# Patient Record
Sex: Male | Born: 1960
Health system: Southern US, Community
[De-identification: ages and names within clinical notes are randomized; demographics above are authoritative.]

## PROBLEM LIST (undated history)

## (undated) DIAGNOSIS — Z9889 Other specified postprocedural states: Secondary | ICD-10-CM

## (undated) DIAGNOSIS — R112 Nausea with vomiting, unspecified: Secondary | ICD-10-CM

## (undated) DIAGNOSIS — R519 Headache, unspecified: Secondary | ICD-10-CM

## (undated) DIAGNOSIS — M1990 Unspecified osteoarthritis, unspecified site: Secondary | ICD-10-CM

## (undated) DIAGNOSIS — R51 Headache: Secondary | ICD-10-CM

## (undated) HISTORY — PX: RETINAL DETACHMENT SURGERY: SHX105

## (undated) HISTORY — DX: Other specified postprocedural states: Z98.890

## (undated) HISTORY — PX: KNEE SURGERY: SHX244

## (undated) HISTORY — PX: RETINAL TEAR REPAIR CRYOTHERAPY: SHX5304

---

## 2001-08-05 ENCOUNTER — Ambulatory Visit (HOSPITAL_COMMUNITY): Admission: RE | Admit: 2001-08-05 | Discharge: 2001-08-05 | Payer: Self-pay | Admitting: Family Medicine

## 2001-08-05 ENCOUNTER — Encounter: Payer: Self-pay | Admitting: Family Medicine

## 2004-08-21 ENCOUNTER — Ambulatory Visit (HOSPITAL_COMMUNITY): Admission: RE | Admit: 2004-08-21 | Discharge: 2004-08-21 | Payer: Self-pay | Admitting: Unknown Physician Specialty

## 2007-03-20 ENCOUNTER — Ambulatory Visit (HOSPITAL_COMMUNITY): Admission: RE | Admit: 2007-03-20 | Discharge: 2007-03-20 | Payer: Self-pay | Admitting: Family Medicine

## 2009-06-02 ENCOUNTER — Ambulatory Visit (HOSPITAL_COMMUNITY): Admission: RE | Admit: 2009-06-02 | Discharge: 2009-06-02 | Payer: Self-pay | Admitting: Orthopaedic Surgery

## 2009-08-02 ENCOUNTER — Ambulatory Visit (HOSPITAL_COMMUNITY): Admission: RE | Admit: 2009-08-02 | Discharge: 2009-08-02 | Payer: Self-pay | Admitting: Orthopaedic Surgery

## 2009-08-05 ENCOUNTER — Encounter (HOSPITAL_COMMUNITY): Admission: RE | Admit: 2009-08-05 | Discharge: 2009-09-04 | Payer: Self-pay | Admitting: Orthopaedic Surgery

## 2010-04-30 LAB — DIFFERENTIAL
Basophils Absolute: 0.1 10*3/uL (ref 0.0–0.1)
Basophils Relative: 1 % (ref 0–1)
Eosinophils Absolute: 0.3 10*3/uL (ref 0.0–0.7)
Monocytes Absolute: 0.7 10*3/uL (ref 0.1–1.0)
Monocytes Relative: 9 % (ref 3–12)
Neutrophils Relative %: 60 % (ref 43–77)

## 2010-04-30 LAB — BASIC METABOLIC PANEL
BUN: 10 mg/dL (ref 6–23)
CO2: 26 mEq/L (ref 19–32)
Calcium: 9.5 mg/dL (ref 8.4–10.5)
Chloride: 105 mEq/L (ref 96–112)
Creatinine, Ser: 0.81 mg/dL (ref 0.4–1.5)
GFR calc Af Amer: >60 mL/min (ref >60)
GFR calc non Af Amer: >60 mL/min (ref >60)
Glucose, Bld: 96 mg/dL (ref 70–99)
Potassium: 4.4 mEq/L (ref 3.5–5.1)
Sodium: 137 mEq/L (ref 135–145)

## 2010-04-30 LAB — CBC
HCT: 44.1 % (ref 39.0–52.0)
Hemoglobin: 14.9 g/dL (ref 13.0–17.0)
MCHC: 33.9 g/dL (ref 30.0–36.0)
MCV: 89.3 fL (ref 78.0–100.0)
Platelets: 201 10*3/uL (ref 150–400)
RBC: 4.93 MIL/uL (ref 4.22–5.81)
RDW: 14.6 % (ref 11.5–15.5)
WBC: 8.2 10*3/uL (ref 4.0–10.5)

## 2010-04-30 LAB — URINALYSIS, ROUTINE W REFLEX MICROSCOPIC
Bilirubin Urine: NEGATIVE
Glucose, UA: NEGATIVE mg/dL
Ketones, ur: NEGATIVE mg/dL
pH: 6 (ref 5.0–8.0)

## 2010-04-30 LAB — SURGICAL PCR SCREEN: MRSA, PCR: NEGATIVE

## 2010-09-19 ENCOUNTER — Ambulatory Visit (HOSPITAL_COMMUNITY)
Admission: RE | Admit: 2010-09-19 | Discharge: 2010-09-19 | Disposition: A | Discharge status: Home / Self Care | Payer: Federal, State, Local not specified - PPO | Source: Ambulatory Visit | Attending: Family Medicine | Admitting: Family Medicine

## 2010-09-19 ENCOUNTER — Other Ambulatory Visit (HOSPITAL_COMMUNITY): Payer: Self-pay | Admitting: Family Medicine

## 2010-09-19 DIAGNOSIS — F411 Generalized anxiety disorder: Secondary | ICD-10-CM

## 2010-09-19 DIAGNOSIS — R0602 Shortness of breath: Secondary | ICD-10-CM | POA: Insufficient documentation

## 2010-09-19 DIAGNOSIS — R002 Palpitations: Secondary | ICD-10-CM

## 2015-07-27 ENCOUNTER — Other Ambulatory Visit (HOSPITAL_COMMUNITY): Payer: Self-pay | Admitting: Orthopaedic Surgery

## 2015-07-27 DIAGNOSIS — M48061 Spinal stenosis, lumbar region without neurogenic claudication: Secondary | ICD-10-CM

## 2015-08-04 ENCOUNTER — Ambulatory Visit (HOSPITAL_COMMUNITY)
Admission: RE | Admit: 2015-08-04 | Discharge: 2015-08-04 | Disposition: A | Discharge status: Home / Self Care | Payer: BC Managed Care – PPO | Source: Ambulatory Visit | Attending: Orthopaedic Surgery | Admitting: Orthopaedic Surgery

## 2015-08-04 DIAGNOSIS — M4806 Spinal stenosis, lumbar region: Secondary | ICD-10-CM | POA: Insufficient documentation

## 2015-08-04 DIAGNOSIS — M47896 Other spondylosis, lumbar region: Secondary | ICD-10-CM | POA: Diagnosis not present

## 2015-08-04 DIAGNOSIS — M48061 Spinal stenosis, lumbar region without neurogenic claudication: Secondary | ICD-10-CM

## 2015-08-19 ENCOUNTER — Encounter (HOSPITAL_COMMUNITY): Payer: Self-pay | Admitting: Pharmacy Technician

## 2015-08-22 ENCOUNTER — Ambulatory Visit (HOSPITAL_COMMUNITY)
Admission: RE | Admit: 2015-08-22 | Discharge: 2015-08-22 | Disposition: A | Discharge status: Home / Self Care | Payer: BC Managed Care – PPO | Source: Ambulatory Visit | Attending: Surgery | Admitting: Surgery

## 2015-08-22 ENCOUNTER — Encounter (HOSPITAL_COMMUNITY): Payer: Self-pay

## 2015-08-22 ENCOUNTER — Encounter (HOSPITAL_COMMUNITY)
Admission: RE | Admit: 2015-08-22 | Discharge: 2015-08-22 | Disposition: A | Discharge status: Home / Self Care | Payer: BC Managed Care – PPO | Source: Ambulatory Visit | Attending: Orthopaedic Surgery | Admitting: Orthopaedic Surgery

## 2015-08-22 DIAGNOSIS — Z01818 Encounter for other preprocedural examination: Secondary | ICD-10-CM | POA: Insufficient documentation

## 2015-08-22 HISTORY — DX: Headache: R51

## 2015-08-22 HISTORY — DX: Other specified postprocedural states: Z98.890

## 2015-08-22 HISTORY — DX: Unspecified osteoarthritis, unspecified site: M19.90

## 2015-08-22 HISTORY — DX: Headache, unspecified: R51.9

## 2015-08-22 HISTORY — DX: Other specified postprocedural states: R11.2

## 2015-08-22 LAB — COMPREHENSIVE METABOLIC PANEL
ALBUMIN: 4.1 g/dL (ref 3.5–5.0)
ALT: 17 U/L (ref 17–63)
ANION GAP: 9 (ref 5–15)
AST: 17 U/L (ref 15–41)
Alkaline Phosphatase: 70 U/L (ref 38–126)
BILIRUBIN TOTAL: 0.6 mg/dL (ref 0.3–1.2)
BUN: 10 mg/dL (ref 6–20)
CO2: 26 mmol/L (ref 22–32)
Calcium: 9.4 mg/dL (ref 8.9–10.3)
Chloride: 106 mmol/L (ref 101–111)
Creatinine, Ser: 1.04 mg/dL (ref 0.61–1.24)
Glucose, Bld: 77 mg/dL (ref 65–99)
POTASSIUM: 3.8 mmol/L (ref 3.5–5.1)
Sodium: 141 mmol/L (ref 135–145)
TOTAL PROTEIN: 6.9 g/dL (ref 6.5–8.1)

## 2015-08-22 LAB — CBC
HEMATOCRIT: 45.5 % (ref 39.0–52.0)
Hemoglobin: 15.4 g/dL (ref 13.0–17.0)
MCH: 30.1 pg (ref 26.0–34.0)
MCHC: 33.8 g/dL (ref 30.0–36.0)
MCV: 88.9 fL (ref 78.0–100.0)
Platelets: 206 10*3/uL (ref 150–400)
RBC: 5.12 MIL/uL (ref 4.22–5.81)
RDW: 14.6 % (ref 11.5–15.5)
WBC: 13.1 10*3/uL — ABNORMAL HIGH (ref 4.0–10.5)

## 2015-08-22 LAB — PROTIME-INR
INR: 1 (ref 0.00–1.49)
PROTHROMBIN TIME: 13.4 s (ref 11.6–15.2)

## 2015-08-22 LAB — SURGICAL PCR SCREEN
MRSA, PCR: NEGATIVE
STAPHYLOCOCCUS AUREUS: NEGATIVE

## 2015-08-22 LAB — APTT: APTT: 33 s (ref 24–37)

## 2015-08-22 NOTE — Progress Notes (Signed)
Pt left prior to getting urine sample, order placed to obtain DOS.

## 2015-08-22 NOTE — Progress Notes (Signed)
PCP: none Cardiologist: none Pt with no cardiac hx other than "flutters" 10 years ago when he wore a heart monitor. No symptoms since then.  CXR: 08/22/15  Pt denies cough, SOB, fever, chest pain, signs of infection at this time.

## 2015-08-22 NOTE — Pre-Procedure Instructions (Signed)
    Craig [name]  08/22/2015     No Pharmacies Listed   Your procedure is scheduled on Wednesday July 12.   [facility] at 1:00 PM   Call this number if you have problems the morning of surgery:  [phone]   Remember:  Do not eat food or drink liquids after midnight.  Take these medicines the morning of surgery with A SIP OF WATER: none  7 days prior to surgery STOP taking any Aspirin, Aleve, Naproxen, Ibuprofen, Motrin, Advil, Goody's, BC's, all herbal medications, fish oil, and all vitamins    Do not wear jewelry,   Do not wear lotions, powders, or perfumes.    Men may shave face and neck.  Do not bring valuables to the hospital.  [facility] is not responsible for any belongings or valuables.  Contacts, dentures or bridgework may not be worn into surgery.  Leave your suitcase in the car.  After surgery it may be brought to your room.  For patients admitted to the hospital, discharge time will be determined by your treatment team.  Patients discharged the day of surgery will not be allowed to drive home.    Special instructions:     [name]- Preparing For Surgery  Before surgery, you can play an important role. Because skin is not sterile, your skin needs to be as free of germs as possible. You can reduce the number of germs on your skin by washing with CHG (chlorahexidine gluconate) Soap before surgery.  CHG is an antiseptic cleaner which kills germs and bonds with the skin to continue killing germs even after washing.  Please do not use if you have an allergy to CHG or antibacterial soaps. If your skin becomes reddened/irritated stop using the CHG.  Do not shave (including legs and underarms) for at least 48 hours prior to first CHG shower. It is OK to shave your face.  Please follow these instructions carefully.   1. Shower the NIGHT BEFORE SURGERY and the MORNING OF SURGERY with CHG.   2. If you chose to wash your hair, wash  your hair first as usual with your normal shampoo.  3. After you shampoo, rinse your hair and body thoroughly to remove the shampoo.  4. Use CHG as you would any other liquid soap. You can apply CHG directly to the skin and wash gently with a scrungie or a clean washcloth.   5. Apply the CHG Soap to your body ONLY FROM THE NECK DOWN.  Do not use on open wounds or open sores. Avoid contact with your eyes, ears, mouth and genitals (private parts). Wash genitals (private parts) with your normal soap.  6. Wash thoroughly, paying special attention to the area where your surgery will be performed.  7. Thoroughly rinse your body with warm water from the neck down.  8. DO NOT shower/wash with your normal soap after using and rinsing off the CHG Soap.  9. Pat yourself dry with a CLEAN TOWEL.   10. Wear CLEAN PAJAMAS   11. Place CLEAN SHEETS on your bed the night of your first shower and DO NOT SLEEP WITH PETS.    Day of Surgery: Do not apply any deodorants/lotions. Please wear clean clothes to the hospital/surgery center.

## 2015-08-23 MED ORDER — CEFAZOLIN SODIUM-DEXTROSE 2-4 GM/100ML-% IV SOLN
2.0000 g | INTRAVENOUS | Status: AC
  Administered 2015-08-24: 2 g via INTRAVENOUS
  Filled 2015-08-23: qty 100

## 2015-08-24 ENCOUNTER — Encounter (HOSPITAL_COMMUNITY): Payer: Self-pay | Admitting: Urology

## 2015-08-24 ENCOUNTER — Ambulatory Visit (HOSPITAL_COMMUNITY): Payer: BC Managed Care – PPO | Admitting: Certified Registered Nurse Anesthetist

## 2015-08-24 ENCOUNTER — Observation Stay (HOSPITAL_COMMUNITY)
Admission: RE | Admit: 2015-08-24 | Discharge: 2015-08-25 | Disposition: A | Discharge status: Home / Self Care | Payer: BC Managed Care – PPO | Source: Ambulatory Visit | Attending: Orthopaedic Surgery | Admitting: Orthopaedic Surgery

## 2015-08-24 ENCOUNTER — Ambulatory Visit (HOSPITAL_COMMUNITY): Payer: BC Managed Care – PPO

## 2015-08-24 ENCOUNTER — Encounter (HOSPITAL_COMMUNITY)
Admission: RE | Disposition: A | Discharge status: Home / Self Care | Payer: Self-pay | Source: Ambulatory Visit | Attending: Orthopaedic Surgery

## 2015-08-24 DIAGNOSIS — Z419 Encounter for procedure for purposes other than remedying health state, unspecified: Secondary | ICD-10-CM

## 2015-08-24 DIAGNOSIS — F17200 Nicotine dependence, unspecified, uncomplicated: Secondary | ICD-10-CM | POA: Insufficient documentation

## 2015-08-24 DIAGNOSIS — M1990 Unspecified osteoarthritis, unspecified site: Secondary | ICD-10-CM | POA: Insufficient documentation

## 2015-08-24 DIAGNOSIS — M47896 Other spondylosis, lumbar region: Secondary | ICD-10-CM | POA: Diagnosis not present

## 2015-08-24 DIAGNOSIS — M48061 Spinal stenosis, lumbar region without neurogenic claudication: Secondary | ICD-10-CM | POA: Diagnosis present

## 2015-08-24 DIAGNOSIS — M4806 Spinal stenosis, lumbar region: Principal | ICD-10-CM | POA: Insufficient documentation

## 2015-08-24 HISTORY — PX: LUMBAR LAMINECTOMY/DECOMPRESSION MICRODISCECTOMY: SHX5026

## 2015-08-24 SURGERY — LUMBAR LAMINECTOMY/DECOMPRESSION MICRODISCECTOMY
Anesthesia: General | Site: Back

## 2015-08-24 MED ORDER — ONDANSETRON HCL 4 MG/2ML IJ SOLN: 4.0000 mg | INTRAMUSCULAR | Status: DC | PRN

## 2015-08-24 MED ORDER — SODIUM CHLORIDE 0.9% FLUSH: 3.0000 mL | Freq: Two times a day (BID) | INTRAVENOUS | Status: DC

## 2015-08-24 MED ORDER — OXYCODONE-ACETAMINOPHEN 5-325 MG PO TABS
1.0000 | ORAL_TABLET | ORAL | Status: DC | PRN
  Administered 2015-08-24 – 2015-08-25 (×4): 2 via ORAL
  Filled 2015-08-24 (×4): qty 2

## 2015-08-24 MED ORDER — VECURONIUM BROMIDE 10 MG IV SOLR
INTRAVENOUS | Status: DC | PRN
  Administered 2015-08-24: 7 mg via INTRAVENOUS
  Administered 2015-08-24: 2 mg via INTRAVENOUS
  Administered 2015-08-24: 3 mg via INTRAVENOUS
  Administered 2015-08-24: 2 mg via INTRAVENOUS

## 2015-08-24 MED ORDER — DOCUSATE SODIUM 100 MG PO CAPS
100.0000 mg | ORAL_CAPSULE | Freq: Two times a day (BID) | ORAL | Status: DC
  Administered 2015-08-24 – 2015-08-25 (×2): 100 mg via ORAL
  Filled 2015-08-24 (×2): qty 1

## 2015-08-24 MED ORDER — METHOCARBAMOL 500 MG PO TABS
500.0000 mg | ORAL_TABLET | Freq: Four times a day (QID) | ORAL | Status: DC | PRN
  Administered 2015-08-25: 500 mg via ORAL
  Filled 2015-08-24: qty 1

## 2015-08-24 MED ORDER — SODIUM CHLORIDE 0.45 % IV SOLN: INTRAVENOUS | Status: DC

## 2015-08-24 MED ORDER — SODIUM CHLORIDE 0.9 % IV SOLN: 250.0000 mL | INTRAVENOUS | Status: DC

## 2015-08-24 MED ORDER — BUPIVACAINE HCL (PF) 0.25 % IJ SOLN
INTRAMUSCULAR | Status: DC | PRN
  Administered 2015-08-24: 10 mL

## 2015-08-24 MED ORDER — FENTANYL CITRATE (PF) 250 MCG/5ML IJ SOLN
INTRAMUSCULAR | Status: AC
  Filled 2015-08-24: qty 5

## 2015-08-24 MED ORDER — HEMOSTATIC AGENTS (NO CHARGE) OPTIME
TOPICAL | Status: DC | PRN
  Administered 2015-08-24: 1 via TOPICAL

## 2015-08-24 MED ORDER — PHENOL 1.4 % MT LIQD
1.0000 | OROMUCOSAL | Status: DC | PRN
Start: 2015-08-24 — End: 2015-08-25

## 2015-08-24 MED ORDER — SCOPOLAMINE 1 MG/3DAYS TD PT72
1.0000 | MEDICATED_PATCH | Freq: Once | TRANSDERMAL | Status: DC
  Administered 2015-08-24: 1.5 mg via TRANSDERMAL
  Filled 2015-08-24: qty 1

## 2015-08-24 MED ORDER — ACETAMINOPHEN 325 MG PO TABS: 650.0000 mg | ORAL_TABLET | ORAL | Status: DC | PRN

## 2015-08-24 MED ORDER — KETOROLAC TROMETHAMINE 30 MG/ML IJ SOLN
30.0000 mg | Freq: Once | INTRAMUSCULAR | Status: AC
  Administered 2015-08-24: 30 mg via INTRAVENOUS

## 2015-08-24 MED ORDER — FENTANYL CITRATE (PF) 100 MCG/2ML IJ SOLN
INTRAMUSCULAR | Status: DC | PRN
  Administered 2015-08-24: 50 ug via INTRAVENOUS
  Administered 2015-08-24: 100 ug via INTRAVENOUS
  Administered 2015-08-24 (×3): 50 ug via INTRAVENOUS

## 2015-08-24 MED ORDER — HYDROMORPHONE HCL 1 MG/ML IJ SOLN
INTRAMUSCULAR | Status: AC
  Filled 2015-08-24: qty 1

## 2015-08-24 MED ORDER — KETOROLAC TROMETHAMINE 30 MG/ML IJ SOLN
INTRAMUSCULAR | Status: AC
  Filled 2015-08-24: qty 1

## 2015-08-24 MED ORDER — 0.9 % SODIUM CHLORIDE (POUR BTL) OPTIME
TOPICAL | Status: DC | PRN
  Administered 2015-08-24: 1000 mL

## 2015-08-24 MED ORDER — LACTATED RINGERS IV SOLN
INTRAVENOUS | Status: DC
  Administered 2015-08-24 (×3): via INTRAVENOUS

## 2015-08-24 MED ORDER — SODIUM CHLORIDE 0.9% FLUSH: 3.0000 mL | INTRAVENOUS | Status: DC | PRN

## 2015-08-24 MED ORDER — CEFAZOLIN IN D5W 1 GM/50ML IV SOLN
1.0000 g | Freq: Three times a day (TID) | INTRAVENOUS | Status: AC
  Administered 2015-08-24 – 2015-08-25 (×2): 1 g via INTRAVENOUS
  Filled 2015-08-24 (×2): qty 50

## 2015-08-24 MED ORDER — MIDAZOLAM HCL 5 MG/5ML IJ SOLN
INTRAMUSCULAR | Status: DC | PRN
  Administered 2015-08-24: 2 mg via INTRAVENOUS

## 2015-08-24 MED ORDER — MEPERIDINE HCL 25 MG/ML IJ SOLN: 6.2500 mg | INTRAMUSCULAR | Status: DC | PRN

## 2015-08-24 MED ORDER — BUPIVACAINE HCL (PF) 0.25 % IJ SOLN
INTRAMUSCULAR | Status: AC
  Filled 2015-08-24: qty 30

## 2015-08-24 MED ORDER — ACETAMINOPHEN 650 MG RE SUPP: 650.0000 mg | RECTAL | Status: DC | PRN

## 2015-08-24 MED ORDER — PROPOFOL 10 MG/ML IV BOLUS
INTRAVENOUS | Status: DC | PRN
  Administered 2015-08-24: 40 mg via INTRAVENOUS
  Administered 2015-08-24: 200 mg via INTRAVENOUS
  Administered 2015-08-24: 120 mg via INTRAVENOUS

## 2015-08-24 MED ORDER — SUGAMMADEX SODIUM 200 MG/2ML IV SOLN
INTRAVENOUS | Status: DC | PRN
  Administered 2015-08-24: 200 mg via INTRAVENOUS

## 2015-08-24 MED ORDER — DEXAMETHASONE SODIUM PHOSPHATE 10 MG/ML IJ SOLN
INTRAMUSCULAR | Status: DC | PRN
  Administered 2015-08-24: 5 mg via INTRAVENOUS

## 2015-08-24 MED ORDER — PROMETHAZINE HCL 25 MG/ML IJ SOLN
6.2500 mg | INTRAMUSCULAR | Status: DC | PRN
Start: 2015-08-24 — End: 2015-08-24

## 2015-08-24 MED ORDER — MIDAZOLAM HCL 2 MG/2ML IJ SOLN
INTRAMUSCULAR | Status: AC
  Filled 2015-08-24: qty 2

## 2015-08-24 MED ORDER — POLYETHYLENE GLYCOL 3350 17 G PO PACK: 17.0000 g | PACK | Freq: Every day | ORAL | Status: DC | PRN

## 2015-08-24 MED ORDER — LIDOCAINE 2% (20 MG/ML) 5 ML SYRINGE
INTRAMUSCULAR | Status: DC | PRN
  Administered 2015-08-24: 100 mg via INTRAVENOUS

## 2015-08-24 MED ORDER — CHLORHEXIDINE GLUCONATE 4 % EX LIQD
60.0000 mL | Freq: Once | CUTANEOUS | Status: DC
Start: 2015-08-24 — End: 2015-08-24

## 2015-08-24 MED ORDER — HYDROMORPHONE HCL 1 MG/ML IJ SOLN
0.2500 mg | INTRAMUSCULAR | Status: DC | PRN
  Administered 2015-08-24 (×4): 0.5 mg via INTRAVENOUS

## 2015-08-24 MED ORDER — PROPOFOL 10 MG/ML IV BOLUS
INTRAVENOUS | Status: AC
  Filled 2015-08-24: qty 40

## 2015-08-24 MED ORDER — HYDROMORPHONE HCL 1 MG/ML IJ SOLN: 0.5000 mg | INTRAMUSCULAR | Status: DC | PRN

## 2015-08-24 MED ORDER — MENTHOL 3 MG MT LOZG
1.0000 | LOZENGE | OROMUCOSAL | Status: DC | PRN
Start: 2015-08-24 — End: 2015-08-25

## 2015-08-24 MED ORDER — DEXTROSE 5 % IV SOLN
500.0000 mg | Freq: Four times a day (QID) | INTRAVENOUS | Status: DC | PRN
  Filled 2015-08-24: qty 5

## 2015-08-24 MED ORDER — ONDANSETRON HCL 4 MG/2ML IJ SOLN
INTRAMUSCULAR | Status: DC | PRN
  Administered 2015-08-24: 4 mg via INTRAVENOUS

## 2015-08-24 SURGICAL SUPPLY — 51 items
ADH SKN CLS APL DERMABOND .7 (GAUZE/BANDAGES/DRESSINGS) ×1
APL SKNCLS STERI-STRIP NONHPOA (GAUZE/BANDAGES/DRESSINGS) ×1
BENZOIN TINCTURE PRP APPL 2/3 (GAUZE/BANDAGES/DRESSINGS) ×1 IMPLANT
BLADE CLIPPER SURG (BLADE) ×1 IMPLANT
BUR ROUND FLUTED 4 SOFT TCH (BURR) IMPLANT
CANISTER SUCT 3000ML PPV (MISCELLANEOUS) ×2 IMPLANT
CLSR STERI-STRIP ANTIMIC 1/2X4 (GAUZE/BANDAGES/DRESSINGS) ×2 IMPLANT
COVER SURGICAL LIGHT HANDLE (MISCELLANEOUS) ×2 IMPLANT
DECANTER SPIKE VIAL GLASS SM (MISCELLANEOUS) ×2 IMPLANT
DERMABOND ADVANCED (GAUZE/BANDAGES/DRESSINGS) ×1
DERMABOND ADVANCED .7 DNX12 (GAUZE/BANDAGES/DRESSINGS) ×1 IMPLANT
DRAPE MICROSCOPE LEICA (MISCELLANEOUS) ×2 IMPLANT
DRAPE PROXIMA HALF (DRAPES) ×4 IMPLANT
DRAPE SURG 17X23 STRL (DRAPES) ×2 IMPLANT
DRSG MEPILEX BORDER 4X4 (GAUZE/BANDAGES/DRESSINGS) ×2 IMPLANT
DURAPREP 26ML APPLICATOR (WOUND CARE) ×2 IMPLANT
ELECT BLADE 4.0 EZ CLEAN MEGAD (MISCELLANEOUS) ×2
ELECT REM PT RETURN 9FT ADLT (ELECTROSURGICAL) ×2
ELECTRODE BLDE 4.0 EZ CLN MEGD (MISCELLANEOUS) IMPLANT
ELECTRODE REM PT RTRN 9FT ADLT (ELECTROSURGICAL) ×1 IMPLANT
GLOVE BIOGEL PI IND STRL 8 (GLOVE) ×2 IMPLANT
GLOVE BIOGEL PI INDICATOR 8 (GLOVE) ×2
GLOVE ORTHO TXT STRL SZ7.5 (GLOVE) ×4 IMPLANT
GOWN STRL REUS W/ TWL LRG LVL3 (GOWN DISPOSABLE) ×2 IMPLANT
GOWN STRL REUS W/ TWL XL LVL3 (GOWN DISPOSABLE) ×1 IMPLANT
GOWN STRL REUS W/TWL 2XL LVL3 (GOWN DISPOSABLE) ×2 IMPLANT
GOWN STRL REUS W/TWL LRG LVL3 (GOWN DISPOSABLE) ×4
GOWN STRL REUS W/TWL XL LVL3 (GOWN DISPOSABLE) ×2
KIT BASIN OR (CUSTOM PROCEDURE TRAY) ×2 IMPLANT
KIT ROOM TURNOVER OR (KITS) ×2 IMPLANT
MANIFOLD NEPTUNE II (INSTRUMENTS) ×2 IMPLANT
NDL HYPO 25GX1X1/2 BEV (NEEDLE) ×1 IMPLANT
NDL SPNL 18GX3.5 QUINCKE PK (NEEDLE) ×1 IMPLANT
NEEDLE HYPO 25GX1X1/2 BEV (NEEDLE) ×2 IMPLANT
NEEDLE SPNL 18GX3.5 QUINCKE PK (NEEDLE) ×2 IMPLANT
NS IRRIG 1000ML POUR BTL (IV SOLUTION) ×2 IMPLANT
PACK LAMINECTOMY ORTHO (CUSTOM PROCEDURE TRAY) ×2 IMPLANT
PAD ARMBOARD 7.5X6 YLW CONV (MISCELLANEOUS) ×4 IMPLANT
PATTIES SURGICAL .5 X.5 (GAUZE/BANDAGES/DRESSINGS) IMPLANT
PATTIES SURGICAL .75X.75 (GAUZE/BANDAGES/DRESSINGS) IMPLANT
SPONGE LAP 4X18 X RAY DECT (DISPOSABLE) ×1 IMPLANT
SURGIFLO W/THROMBIN 8M KIT (HEMOSTASIS) ×1 IMPLANT
SUT VIC AB 0 CT1 27 (SUTURE) ×2
SUT VIC AB 0 CT1 27XBRD ANBCTR (SUTURE) ×1 IMPLANT
SUT VIC AB 1 CT1 27 (SUTURE) ×2
SUT VIC AB 1 CT1 27XBRD ANBCTR (SUTURE) IMPLANT
SUT VIC AB 2-0 CT1 27 (SUTURE) ×2
SUT VIC AB 2-0 CT1 TAPERPNT 27 (SUTURE) ×1 IMPLANT
SUT VIC AB 3-0 X1 27 (SUTURE) IMPLANT
TOWEL OR 17X24 6PK STRL BLUE (TOWEL DISPOSABLE) ×2 IMPLANT
TOWEL OR 17X26 10 PK STRL BLUE (TOWEL DISPOSABLE) ×2 IMPLANT

## 2015-08-24 NOTE — Progress Notes (Signed)
Pt unable to void at this time, states he will try to void prior to surgery for u/a.

## 2015-08-24 NOTE — Brief Op Note (Signed)
08/24/2015  5:16 PM  PATIENT:  Craig [name]  [age] y.o. male  PRE-OPERATIVE DIAGNOSIS:  L3-4 STENOSIS  POST-OPERATIVE DIAGNOSIS:  L3-4 STENOSIS  PROCEDURE:  Procedure(s): L3-4 CENTRAL DECOMPRESSION  (N/A)  SURGEON:  Surgeon(s) and Role:    * Eldred MangesMark C Yates, MD - Primary  PHYSICIAN ASSISTANT: Priscilla Kirstein m Dyshawn Cangelosi pa-c    ANESTHESIA:   general  EBL:  Total I/O In: 1000 [I.V.:1000] Out: 75 [Blood:75]  BLOOD ADMINISTERED:none  DRAINS: none   LOCAL MEDICATIONS USED:  NONE  SPECIMEN:  No Specimen  DISPOSITION OF SPECIMEN:  N/A  COUNTS:  YES  TOURNIQUET:  * No tourniquets in log *  DICTATION: .Dragon Dictation  PLAN OF CARE: Admit for overnight observation  PATIENT DISPOSITION:  PACU - hemodynamically stable.

## 2015-08-24 NOTE — Interval H&P Note (Signed)
History and Physical Interval Note:  08/24/2015 2:20 PM  Craig NancyRandy A [name]  has presented today for surgery, with the diagnosis of L3-4 STENOSIS  The various methods of treatment have been discussed with the patient and family. After consideration of risks, benefits and other options for treatment, the patient has consented to  Procedure(s): L3-4 CENTRAL DECOMPRESSION, POSSIBLE L3-4 MICRODISCECTOMY  (N/A) as a surgical intervention .  The patient's history has been reviewed, patient examined, no change in status, stable for surgery.  I have reviewed the patient's chart and labs.  Questions were answered to the patient's satisfaction.     Craig [name]

## 2015-08-24 NOTE — Anesthesia Procedure Notes (Signed)
Procedure Name: Intubation Date/Time: 08/24/2015 3:41 PM Performed by: Bobbie StackANDERSON, Allesandra Huebsch KIRSTEN Pre-anesthesia Checklist: Patient identified, Emergency Drugs available, Patient being monitored, Suction available and Timeout performed Patient Re-evaluated:Patient Re-evaluated prior to inductionOxygen Delivery Method: Circle system utilized Preoxygenation: Pre-oxygenation with 100% oxygen Intubation Type: IV induction Ventilation: Mask ventilation without difficulty Laryngoscope Size: Miller and 3 Grade View: Grade I Tube type: Oral Tube size: 8.0 mm Number of attempts: 1 Airway Equipment and Method: Stylet Placement Confirmation: ETT inserted through vocal cords under direct vision,  positive ETCO2 and breath sounds checked- equal and bilateral Secured at: 25 cm Tube secured with: Tape Dental Injury: Teeth and Oropharynx as per pre-operative assessment

## 2015-08-24 NOTE — Anesthesia Preprocedure Evaluation (Signed)
Anesthesia Evaluation  Patient identified by MRN, date of birth, ID band Patient awake    Reviewed: Allergy & Precautions, H&P , NPO status , Patient's Chart, lab work & pertinent test results, reviewed documented beta blocker date and time   Airway Mallampati: I  TM Distance: >3 FB Neck ROM: full    Dental no notable dental hx. (+) Teeth Intact   Pulmonary Current Smoker,    Pulmonary exam normal        Cardiovascular negative cardio ROS Normal cardiovascular exam     Neuro/Psych negative psych ROS   GI/Hepatic negative GI ROS, Neg liver ROS,   Endo/Other  negative endocrine ROS  Renal/GU negative Renal ROS     Musculoskeletal   Abdominal Normal abdominal exam  (+)   Peds  Hematology negative hematology ROS (+)   Anesthesia Other Findings   Reproductive/Obstetrics negative OB ROS                             Anesthesia Physical Anesthesia Plan  ASA: II  Anesthesia Plan: General   Post-op Pain Management:    Induction: Intravenous  Airway Management Planned: Oral ETT  Additional Equipment:   Intra-op Plan:   Post-operative Plan: Extubation in OR  Informed Consent: I have reviewed the patients History and Physical, chart, labs and discussed the procedure including the risks, benefits and alternatives for the proposed anesthesia with the patient or authorized representative who has indicated his/her understanding and acceptance.   Dental Advisory Given  Plan Discussed with: CRNA, Surgeon and Anesthesiologist  Anesthesia Plan Comments:         Anesthesia Quick Evaluation

## 2015-08-24 NOTE — Transfer of Care (Signed)
Immediate Anesthesia Transfer of Care Note  Patient: Craig [name]  Procedure(s) Performed: Procedure(s): L3-4 CENTRAL DECOMPRESSION  (N/A)  Patient Location: PACU  Anesthesia Type:General  Level of Consciousness: awake and patient cooperative  Airway & Oxygen Therapy: Patient Spontanous Breathing and Patient connected to nasal cannula oxygen  Post-op Assessment: Report given to RN and Patient moving all extremities X 4  Post vital signs: Reviewed and stable  Last Vitals:  Filed Vitals:   08/24/15 1340  BP: 118/76  Pulse: 81  Temp: 37.2 C  Resp: 20    Last Pain:  Filed Vitals:   08/24/15 1613  PainSc: 0-No pain         Complications: No apparent anesthesia complications

## 2015-08-24 NOTE — H&P (Signed)
Craig [name] is an 55 y.o. male.   A 55 year old male returns with ongoing problems with gradually progressive back pain and pain with standing.  He does better bending forward and leaning.  He has problems with prolonged walking with leg pain.  He gets relief with sitting.  He does better if he leans over a grocery cart.  He has had a previous MRI in 2006 and has had a new MRI for comparison due to his progression of symptoms, done on 08/04/2015, which is available for review.   PAST SURGICAL HISTORY:  Include past knee surgeries, all on his right knee.   SOCIAL HISTORY:  He is here with his wife.  He is married.  He has been a smoker.  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     Past Medical History  Diagnosis Date  . PONV (postoperative nausea and vomiting)     after knee surgeries  . Headache     occasional migraines treated with excedrin "a long time ago"  . Arthritis     Past Surgical History  Procedure Laterality Date  . Knee surgery      "bone spurr or cartilage operations", x6    No family history on file. Social History:  reports that he has been smoking Cigars.  He does not have any smokeless tobacco history on file. He reports that he does not drink alcohol or use illicit drugs.  Allergies:  Allergies  Allergen Reactions  . No Known Allergies     No prescriptions prior to admission    Results for orders placed or performed during the hospital encounter of 08/22/15 (from the past 48 hour(s))  APTT     Status: None   Collection Time: 08/22/15  2:40 PM  Result Value Ref Range   aPTT 33 24 - 37 seconds  CBC     Status: Abnormal   Collection Time: 08/22/15  2:40 PM  Result Value Ref Range   WBC 13.1 (H) 4.0 - 10.5 K/uL   RBC 5.12 4.22 - 5.81 MIL/uL   Hemoglobin 15.4 13.0 - 17.0 g/dL   HCT 45.5 39.0 - 52.0 %   MCV 88.9 78.0 - 100.0 fL   MCH 30.1 26.0 - 34.0 pg   MCHC 33.8  30.0 - 36.0 g/dL   RDW 14.6 11.5 - 15.5 %   Platelets 206 150 - 400 K/uL  Comprehensive metabolic panel     Status: None   Collection Time: 08/22/15  2:40 PM  Result Value Ref Range   Sodium 141 135 - 145 mmol/L   Potassium 3.8 3.5 - 5.1 mmol/L   Chloride 106 101 - 111 mmol/L   CO2 26 22 - 32 mmol/L   Glucose, Bld 77 65 - 99 mg/dL   BUN 10 6 - 20 mg/dL   Creatinine, Ser 1.04 0.61 - 1.24 mg/dL   Calcium 9.4 8.9 - 10.3 mg/dL   Total Protein 6.9 6.5 - 8.1 g/dL   Albumin 4.1 3.5 - 5.0 g/dL   AST 17 15 - 41 U/L   ALT 17 17 - 63 U/L   Alkaline Phosphatase 70 38 - 126 U/L   Total Bilirubin 0.6 0.3 - 1.2 mg/dL   GFR calc non Af Amer >60 >60 mL/min   GFR calc Af Amer >60 >60 mL/min    Comment: (NOTE) The eGFR has been calculated using the  CKD EPI equation. This calculation has not been validated in all clinical situations. eGFR's persistently <60 mL/min signify possible Chronic Kidney Disease.    Anion gap 9 5 - 15  Protime-INR     Status: None   Collection Time: 08/22/15  2:40 PM  Result Value Ref Range   Prothrombin Time 13.4 11.6 - 15.2 seconds   INR 1.00 0.00 - 1.49  Surgical pcr screen     Status: None   Collection Time: 08/22/15  2:40 PM  Result Value Ref Range   MRSA, PCR NEGATIVE NEGATIVE   Staphylococcus aureus NEGATIVE NEGATIVE    Comment:        The Xpert SA Assay (FDA approved for NASAL specimens in patients over 64 years of age), is one component of a comprehensive surveillance program.  Test performance has been validated by [facility] for patients greater than or equal to 29 year old. It is not intended to diagnose infection nor to guide or monitor treatment.    Dg Chest 2 View  08/22/2015  CLINICAL DATA:  55 year old male preoperative study for lumbar surgery. Initial encounter. EXAM: CHEST  2 VIEW COMPARISON:  09/19/2010. FINDINGS: Small calcified hilar lymph nodes again noted. Mediastinal contours remain normal. Lung volumes are stable and within normal  limits. No pneumothorax, pulmonary edema, pleural effusion or confluent pulmonary opacity. Visualized tracheal air column is within normal limits. No acute osseous abnormality identified. IMPRESSION: No acute cardiopulmonary abnormality. Electronically Signed   By: Genevie Ann M.D.   On: 08/22/2015 16:04    Review of Systems  Constitutional: Negative.   HENT: Negative.   Eyes: Negative.   Respiratory: Negative.   Cardiovascular: Negative.   Gastrointestinal: Negative.   Genitourinary: Negative.   Musculoskeletal: Positive for back pain.  Skin: Negative.   Neurological: Negative.   Psychiatric/Behavioral: Negative.     There were no vitals taken for this visit. Physical Exam  Constitutional: He is oriented to person, place, and time. No distress.  HENT:  Head: Atraumatic.  Eyes: EOM are normal.  Neck: Normal range of motion.  Cardiovascular: Normal rate.   Respiratory: No respiratory distress.  GI: He exhibits no distension.  Musculoskeletal: He exhibits tenderness.  Neurological: He is alert and oriented to person, place, and time.  Skin: Skin is warm and dry.  Psychiatric: He has a normal mood and affect.     PHYSICAL EXAMINATION:  The patient is alert and oriented.  Height 6 feet 5 inches.  Weight 200 pounds.  BP 106/70.  Pulse 76.  Extraocular movements are intact.  Good visual acuity with glasses.  Mild brachial plexus tenderness.  Some discomfort with forward flexion and extension with 20% to 30% loss of rotation.  Upper extremity reflexes are intact.  Quads, hamstrings, anterior tib, EHL, heel-and-toe walking is normal.  No abdominal tenderness.  No audible wheezing.  Distal pulses are normal.    RADIOGRAPHS:  X-rays  show facet hypertrophy at L4-5 and L5-S1.  MRI scan on 08/04/2015, shows shallow disc at L2-3 on the left.  He is having more symptoms on the right.  This really has only mildly progressed.  He only has mild narrowing.  At L3-4, he has had significant progression to  moderate to severe central canal stenosis, primarily due to ligament hypertrophy and less facet hypertrophy and less disc bulging.  Shallow bulge at L4-5 with only mild narrowing.   ASSESSMENT:  Progressive spondylosis at L3-4, now with moderately severe central canal stenosis and moderately severe left  foraminal narrowing.    PLAN:  We discussed options and recommendations for him.  At present he is having spinal stenosis symptoms, primarily more than radicular symptoms, and options would be a central decompression procedure done at the L3-4 level with possible left L3-4 microdiscectomy if needed.  We discussed overnight stay, operative intervention, the possibility of a dural tear.  Rate quoted less than 5% for first-time decompression surgery.  We reviewed the scans and looked at his old scan from 2006 and the progression to his current scan.  Postoperative walking program discussed.  Usual length of time going back to work 3 to 6 weeks.  He asked if he would be able to play golf some and this may be related to mild changes at his other levels and he understands that it should help with his standing and walking and neurogenic claudication symptoms, but he might still have problems if he tried to resume golf activities.  He can consider his options.  We discussed the use of the operative microscope and operative technique.  He will call if he would like to proceed with scheduling.    Lanae Crumbly, PA-C 08/24/2015, 10:54 AM

## 2015-08-25 ENCOUNTER — Encounter (HOSPITAL_COMMUNITY): Payer: Self-pay | Admitting: Orthopaedic Surgery

## 2015-08-25 DIAGNOSIS — M4806 Spinal stenosis, lumbar region: Secondary | ICD-10-CM | POA: Diagnosis not present

## 2015-08-25 MED ORDER — METHOCARBAMOL 500 MG PO TABS: 500.0000 mg | ORAL_TABLET | Freq: Four times a day (QID) | ORAL | Status: DC | PRN

## 2015-08-25 MED ORDER — OXYCODONE-ACETAMINOPHEN 5-325 MG PO TABS: 1.0000 | ORAL_TABLET | ORAL | Status: DC | PRN

## 2015-08-25 NOTE — Progress Notes (Signed)
Subjective: 1 Day Post-Op Procedure(s) (LRB): L3-4 CENTRAL DECOMPRESSION  (N/A) Patient reports pain as mild.    Objective: Vital signs in last 24 hours: Temp:  [98.2 F (36.8 C)-98.9 F (37.2 C)] 98.2 F (36.8 C) (07/13 0203) Pulse Rate:  [60-81] 60 (07/13 0203) Resp:  [13-24] 17 (07/13 0203) BP: (108-123)/(67-82) 109/76 mmHg (07/13 0203) SpO2:  [96 %-100 %] 99 % (07/13 0203) Weight:  [94.802 kg (209 lb)] 94.802 kg (209 lb) (07/12 1340)  Intake/Output from previous day: 07/12 0701 - 07/13 0700 In: 1100 [I.V.:1100] Out: 600 [Urine:525; Blood:75] Intake/Output this shift:     Recent Labs  08/22/15 1440  HGB 15.4    Recent Labs  08/22/15 1440  WBC 13.1*  RBC 5.12  HCT 45.5  PLT 206    Recent Labs  08/22/15 1440  NA 141  K 3.8  CL 106  CO2 26  BUN 10  CREATININE 1.04  GLUCOSE 77  CALCIUM 9.4    Recent Labs  08/22/15 1440  INR 1.00    Neurologically intact  Assessment/Plan: 1 Day Post-Op Procedure(s) (LRB): L3-4 CENTRAL DECOMPRESSION  (N/A) Plan: discharge home  Craig [name] C 08/25/2015, 7:29 AM

## 2015-08-25 NOTE — Progress Notes (Signed)
Discharge instructions given. Pt verbalized understanding and all questions were answered.  

## 2015-08-25 NOTE — Discharge Instructions (Signed)
Ok to shower. Apply dressing after shower so shirt does not rub on incision. Walk daily. See Dr. Ophelia CharterYates in one week

## 2015-08-25 NOTE — Op Note (Signed)
NAMValentino Nose:  [name], Craig [name]                ACCOUNT NO.:  [id number]  MEDICAL RECORD NO.:  [id number]  LOCATION:                               FACILITY:  [facility]  PHYSICIAN:  [name] C. Ophelia CharterYates, M.D.    DATE OF BIRTH:  [birth year]  DATE OF PROCEDURE: DATE OF DISCHARGE:                              OPERATIVE REPORT   PREOPERATIVE DIAGNOSIS:  L3-4 central stenosis.  POSTOPERATIVE DIAGNOSES:  L3-4 central stenosis.  PROCEDURE:  L3-4 decompression, complete laminectomy L3, partial laminectomy L4 with bilateral lateral recess decompression.  SURGEON:  [name], M.D.  ASSISTANT:  [name], PA-C, medically necessary and present for the entire procedure.  ANESTHESIA:  General plus Marcaine local.  ESTIMATED BLOOD LOSS:  100 mL.  DESCRIPTION OF PROCEDURE:  After induction of general anesthesia, patient was placed prone, careful padding arms at 90, rolled yellow foam pads underneath the shoulders.  Back was prepped with DuraPrep, calf bumpers. Preoperative Ancef. Time-out procedure. Area squared with towels, Betadine, Steri-Drape laminectomy sheets, spinal needles placed at the planned level based on palpable landmarks.  This was appropriate above and below the L3-4 level.  Midline incision was made. Subperiosteal dissection on the lamina self-retaining McCullough retractor.  Second x-ray was taken with the Kocher clamp was placed above and below for the 3-4 level for planned area of decompression. Posterior elements were removed, removing the spinous process of L3, top portion of L4.  Lamina was thinned with a rongeur and then with the 4 mm bur.  Thick chunks of ligament were removed.  There was extreme hypertrophy of the ligamentum causing severe stenosis, which look more impressive than the preoperative MRI.  There was crystalline type formation either from epidurals or possibly from gout present, intermixed with the ligament.  Only mild overhanging spurs were present from the facets.   Once the ligaments were removed from the gutters using operative microscope, there was minimal bulging of the discs.  No microdiskectomy was performed.  Lateral recess decompression was performed bilaterally and the [facility] retractor could be passed out the foramina over the top of the nerve and shoulder of the nerve without areas of compression.  Some Surgiflo was placed in the gutters to control some venous oozing.  Some bone wax had been used on the bone edges.  Copious irrigation.  Standard closure #1 Vicryl, 2-0 Vicryl subcutaneous tissue, subcuticular closure Marcaine infiltration, skin postop dressing and transferred to recovery room. Instrument count and needle count was correct.     [name], M.D.   ______________________________ [name], M.D.    MCY/MEDQ  D:  08/24/2015  T:  08/25/2015  Job:  [id number]

## 2015-08-25 NOTE — H&P (Deleted)
NAMValentino Nose:  Mastropietro, Marcy                ACCOUNT NO.:  [id number]  MEDICAL RECORD NO.:  [id number]  LOCATION:                               FACILITY:  [facility]  PHYSICIAN:  Cedricka Sackrider C. Ophelia CharterYates, M.D.    DATE OF BIRTH:  03/22/1960  DATE OF ADMISSION:  08/24/2015 DATE OF DISCHARGE:                             Operative Note   PREOPERATIVE DIAGNOSIS:  L3-4 central stenosis.  POSTOPERATIVE DIAGNOSES:  L3-4 central stenosis.  PROCEDURE:  L3-4 decompression, complete laminectomy L3, partial laminectomy L4 with bilateral lateral recess decompression.  SURGEON:  Riordan Walle C. Ophelia CharterYates, M.D.  ASSISTANT:  [name], PA-C, medically necessary and present for the entire procedure.  ANESTHESIA:  General plus Marcaine local.  ESTIMATED BLOOD LOSS:  100 mL.  DESCRIPTION OF PROCEDURE:  After induction of general anesthesia, patient was placed prone, careful padding arms at 90, rolled yellow foam pads underneath the shoulders.  Back was prepped with DuraPrep, calf bumpers. Preoperative Ancef. Time-out procedure. Area squared with towels, Betadine, Steri-Drape laminectomy sheets, spinal needles placed at the planned level based on palpable landmarks.  This was appropriate above and below the L3-4 level.  Midline incision was made. Subperiosteal dissection on the lamina self-retaining McCullough retractor.  Second x-ray was taken with the Kocher clamp was placed above and below for the 3-4 level for planned area of decompression. Posterior elements were removed, removing the spinous process of L3, top portion of L4.  Lamina was thinned with a rongeur and then with the 4 mm bur.  Thick chunks of ligament were removed.  There was extreme hypertrophy of the ligamentum causing severe stenosis, which look more impressive than the preoperative MRI.  There was crystalline type formation either from epidurals or possibly from gout present, intermixed with the ligament.  Only mild overhanging spurs were present from the  facets.  Once the ligaments were removed from the gutters using operative microscope, there was minimal bulging of the discs.  No microdiskectomy was performed.  Lateral recess decompression was performed bilaterally and the [facility] retractor could be passed out the foramina over the top of the nerve and shoulder of the nerve without areas of compression.  Some Surgiflo was placed in the gutters to control some venous oozing.  Some bone wax had been used on the bone edges.  Copious irrigation.  Standard closure #1 Vicryl, 2-0 Vicryl subcutaneous tissue, subcuticular closure Marcaine infiltration, skin postop dressing and transferred to recovery room. Instrument count and needle count was correct.     Colbie Sliker C. Ophelia CharterYates, M.D.   ______________________________ [name], M.D.    MCY/MEDQ  D:  08/24/2015  T:  08/25/2015  Job:  [id number]

## 2015-09-07 NOTE — Anesthesia Postprocedure Evaluation (Signed)
Anesthesia Post Note  Patient: Craig [name]  Procedure(s) Performed: Procedure(s) (LRB): L3-4 CENTRAL DECOMPRESSION  (N/A)  Patient location during evaluation: PACU Anesthesia Type: General Level of consciousness: awake and alert Pain management: pain level controlled Vital Signs Assessment: post-procedure vital signs reviewed and stable Respiratory status: spontaneous breathing, nonlabored ventilation, respiratory function stable and patient connected to nasal cannula oxygen Cardiovascular status: blood pressure returned to baseline and stable Postop Assessment: no signs of nausea or vomiting Anesthetic complications: no    Last Vitals:  Vitals:   08/25/15 0203 08/25/15 0635  BP: 109/76 118/76  Pulse: 60 (!) 57  Resp: 17 15  Temp: 36.8 C 37.4 C    Last Pain:  Vitals:   08/25/15 1007  TempSrc:   PainSc: 7                  Kennieth Rad

## 2015-09-15 NOTE — Discharge Summary (Signed)
Patient ID: JI FELDNER MRN: [id number] DOB/AGE: 53/20/1962 53 y.o.  Admit date: 08/24/2015 Discharge date: 09/15/2015  Admission Diagnoses:  Active Problems:   Lumbar stenosis   Discharge Diagnoses:  Active Problems:   Lumbar stenosis  status post Procedure(s): L3-4 CENTRAL DECOMPRESSION   Past Medical History:  Diagnosis Date  . Arthritis   . Headache    occasional migraines treated with excedrin "a long time ago"  . PONV (postoperative nausea and vomiting)    after knee surgeries    Surgeries: Procedure(s): L3-4 CENTRAL DECOMPRESSION  on 08/24/2015   Consultants:   Discharged Condition: Improved  Hospital Course: Craig [name] is an 55 y.o. male who was admitted 08/24/2015 for operative treatment of lumbar stenosis. Patient failed conservative treatments (please see the history and physical for the specifics) and had severe unremitting pain that affects sleep, daily activities and work/hobbies. After pre-op clearance, the patient was taken to the operating room on 08/24/2015 and underwent  Procedure(s): L3-4 CENTRAL DECOMPRESSION .    Patient was given perioperative antibiotics:  Anti-infectives    Start     Dose/Rate Route Frequency Ordered Stop   08/24/15 2200  ceFAZolin (ANCEF) IVPB 1 g/50 mL premix     1 g 100 mL/hr over 30 Minutes Intravenous Every 8 hours 08/24/15 1851 08/25/15 0645   08/24/15 1430  ceFAZolin (ANCEF) IVPB 2g/100 mL premix     2 g 200 mL/hr over 30 Minutes Intravenous To [facility] 08/23/15 1020 08/24/15 1615       Patient was given sequential compression devices and early ambulation to prevent DVT.   Patient benefited maximally from hospital stay and there were no complications. At the time of discharge, the patient was urinating/moving their bowels without difficulty, tolerating a regular diet, pain is controlled with oral pain medications and they have been cleared by PT/OT.   Recent vital signs: No data found.    Recent  laboratory studies: No results for input(s): WBC, HGB, HCT, PLT, NA, K, CL, CO2, BUN, CREATININE, GLUCOSE, INR, CALCIUM in the last 72 hours.  Invalid input(s): PT, 2   Discharge Medications:     Medication List    TAKE these medications   methocarbamol 500 MG tablet Commonly known as:  ROBAXIN Take 1 tablet (500 mg total) by mouth every 6 (six) hours as needed for muscle spasms.   multivitamin tablet Take 1 tablet by mouth daily. Patient takes 3-4 times a week "Daily advantage"   oxyCODONE-acetaminophen 5-325 MG tablet Commonly known as:  PERCOCET/ROXICET Take 1-2 tablets by mouth every 4 (four) hours as needed for moderate pain.       Diagnostic Studies: Dg Chest 2 View  Result Date: 08/22/2015 CLINICAL DATA:  55 year old male preoperative study for lumbar surgery. Initial encounter. EXAM: CHEST  2 VIEW COMPARISON:  09/19/2010. FINDINGS: Small calcified hilar lymph nodes again noted. Mediastinal contours remain normal. Lung volumes are stable and within normal limits. No pneumothorax, pulmonary edema, pleural effusion or confluent pulmonary opacity. Visualized tracheal air column is within normal limits. No acute osseous abnormality identified. IMPRESSION: No acute cardiopulmonary abnormality. Electronically Signed   By: Odessa Fleming M.D.   On: 08/22/2015 16:04   Dg Lumbar Spine 2-3 Views  Result Date: 08/24/2015 CLINICAL DATA:  Localizers for L3-4 central decompression. EXAM: LUMBAR SPINE - 2-3 VIEW COMPARISON:  08/04/2015 MR FINDINGS: The same numbering system from recent MRI will be utilized. A single lateral intraoperative view of the lumbar spine demonstrate 2 posterior metallic  needles. The superior needle lies between the L2 and L3 spinous processes directed at the superior aspect of L3. The inferior needle is directed at the L3-4 interspace. IMPRESSION: Localizers as described. Electronically Signed   By: Harmon Pier M.D.   On: 08/24/2015 17:53      Follow-up Information     Follow up In 1 week.           Discharge Plan:  discharge to home  Disposition:     Signed: Naida Sleight  09/15/2015, 10:45 AM

## 2018-11-04 ENCOUNTER — Inpatient Hospital Stay (HOSPITAL_COMMUNITY)
Admission: EM | Admit: 2018-11-04 | Discharge: 2018-11-06 | DRG: 287 | Disposition: A | Discharge status: Home / Self Care | Payer: BC Managed Care – PPO | Attending: Interventional Cardiology | Admitting: Interventional Cardiology

## 2018-11-04 ENCOUNTER — Encounter (HOSPITAL_COMMUNITY): Payer: Self-pay | Admitting: Emergency Medicine

## 2018-11-04 ENCOUNTER — Emergency Department (HOSPITAL_COMMUNITY): Payer: BC Managed Care – PPO

## 2018-11-04 ENCOUNTER — Other Ambulatory Visit: Payer: Self-pay

## 2018-11-04 DIAGNOSIS — F17290 Nicotine dependence, other tobacco product, uncomplicated: Secondary | ICD-10-CM | POA: Diagnosis present

## 2018-11-04 DIAGNOSIS — Z8249 Family history of ischemic heart disease and other diseases of the circulatory system: Secondary | ICD-10-CM

## 2018-11-04 DIAGNOSIS — R002 Palpitations: Secondary | ICD-10-CM | POA: Diagnosis present

## 2018-11-04 DIAGNOSIS — I514 Myocarditis, unspecified: Secondary | ICD-10-CM | POA: Diagnosis not present

## 2018-11-04 DIAGNOSIS — R079 Chest pain, unspecified: Secondary | ICD-10-CM | POA: Diagnosis not present

## 2018-11-04 DIAGNOSIS — R778 Other specified abnormalities of plasma proteins: Secondary | ICD-10-CM | POA: Diagnosis present

## 2018-11-04 DIAGNOSIS — I214 Non-ST elevation (NSTEMI) myocardial infarction: Secondary | ICD-10-CM

## 2018-11-04 DIAGNOSIS — Z20828 Contact with and (suspected) exposure to other viral communicable diseases: Secondary | ICD-10-CM | POA: Diagnosis present

## 2018-11-04 DIAGNOSIS — R7989 Other specified abnormal findings of blood chemistry: Secondary | ICD-10-CM | POA: Diagnosis present

## 2018-11-04 DIAGNOSIS — Z7982 Long term (current) use of aspirin: Secondary | ICD-10-CM

## 2018-11-04 LAB — BASIC METABOLIC PANEL
Anion gap: 11 (ref 5–15)
BUN: 19 mg/dL (ref 6–20)
CO2: 23 mmol/L (ref 22–32)
Calcium: 9.1 mg/dL (ref 8.9–10.3)
Chloride: 103 mmol/L (ref 98–111)
Creatinine, Ser: 0.95 mg/dL (ref 0.61–1.24)
GFR calc Af Amer: >60 mL/min (ref >60)
GFR calc non Af Amer: >60 mL/min (ref >60)
Glucose, Bld: 110 mg/dL — ABNORMAL HIGH (ref 70–99)
Potassium: 4 mmol/L (ref 3.5–5.1)
Sodium: 137 mmol/L (ref 135–145)

## 2018-11-04 LAB — CBC
HCT: 42.9 % (ref 39.0–52.0)
Hemoglobin: 13.7 g/dL (ref 13.0–17.0)
MCH: 28.9 pg (ref 26.0–34.0)
MCHC: 31.9 g/dL (ref 30.0–36.0)
MCV: 90.5 fL (ref 80.0–100.0)
Platelets: 243 10*3/uL (ref 150–400)
RBC: 4.74 MIL/uL (ref 4.22–5.81)
RDW: 14.2 % (ref 11.5–15.5)
WBC: 15.2 10*3/uL — ABNORMAL HIGH (ref 4.0–10.5)
nRBC: 0 % (ref 0.0–0.2)

## 2018-11-04 LAB — TROPONIN I (HIGH SENSITIVITY): Troponin I (High Sensitivity): 131 ng/L (ref <18)

## 2018-11-04 MED ORDER — NITROGLYCERIN 2 % TD OINT
1.0000 [in_us] | TOPICAL_OINTMENT | Freq: Once | TRANSDERMAL | Status: AC
  Administered 2018-11-04: 1 [in_us] via TOPICAL
  Filled 2018-11-04: qty 1

## 2018-11-04 MED ORDER — ACETAMINOPHEN 500 MG PO TABS
1000.0000 mg | ORAL_TABLET | Freq: Once | ORAL | Status: AC
  Administered 2018-11-04: 1000 mg via ORAL
  Filled 2018-11-04: qty 2

## 2018-11-04 MED ORDER — SODIUM CHLORIDE 0.9% FLUSH: 3.0000 mL | Freq: Once | INTRAVENOUS | Status: DC

## 2018-11-04 NOTE — ED Triage Notes (Signed)
Pt arrives via POV w/complaints of chest pain after working out. Pt denies SOB at this time.

## 2018-11-04 NOTE — ED Notes (Signed)
Date and time results received: 11/04/18 2325   Test: Troponin Critical Value: 131  Name of Provider Notified: Tomi Bamberger  Orders Received? Or Actions Taken?: n/a

## 2018-11-05 ENCOUNTER — Encounter (HOSPITAL_COMMUNITY): Payer: Self-pay | Admitting: Student

## 2018-11-05 ENCOUNTER — Inpatient Hospital Stay (HOSPITAL_COMMUNITY): Payer: BC Managed Care – PPO

## 2018-11-05 ENCOUNTER — Encounter (HOSPITAL_COMMUNITY)
Admission: EM | Disposition: A | Discharge status: Home / Self Care | Payer: Self-pay | Source: Home / Self Care | Attending: Interventional Cardiology

## 2018-11-05 DIAGNOSIS — I514 Myocarditis, unspecified: Secondary | ICD-10-CM | POA: Diagnosis present

## 2018-11-05 DIAGNOSIS — F17290 Nicotine dependence, other tobacco product, uncomplicated: Secondary | ICD-10-CM | POA: Diagnosis present

## 2018-11-05 DIAGNOSIS — I214 Non-ST elevation (NSTEMI) myocardial infarction: Secondary | ICD-10-CM

## 2018-11-05 DIAGNOSIS — R079 Chest pain, unspecified: Secondary | ICD-10-CM | POA: Diagnosis present

## 2018-11-05 DIAGNOSIS — R7989 Other specified abnormal findings of blood chemistry: Secondary | ICD-10-CM | POA: Diagnosis not present

## 2018-11-05 DIAGNOSIS — Z7982 Long term (current) use of aspirin: Secondary | ICD-10-CM | POA: Diagnosis not present

## 2018-11-05 DIAGNOSIS — R002 Palpitations: Secondary | ICD-10-CM

## 2018-11-05 DIAGNOSIS — Z20828 Contact with and (suspected) exposure to other viral communicable diseases: Secondary | ICD-10-CM | POA: Diagnosis present

## 2018-11-05 DIAGNOSIS — R778 Other specified abnormalities of plasma proteins: Secondary | ICD-10-CM | POA: Diagnosis present

## 2018-11-05 DIAGNOSIS — Z8249 Family history of ischemic heart disease and other diseases of the circulatory system: Secondary | ICD-10-CM | POA: Diagnosis not present

## 2018-11-05 HISTORY — PX: LEFT HEART CATH AND CORONARY ANGIOGRAPHY: CATH118249

## 2018-11-05 HISTORY — PX: CARDIAC CATHETERIZATION: SHX172

## 2018-11-05 LAB — SARS CORONAVIRUS 2 BY RT PCR (HOSPITAL ORDER, PERFORMED IN ~~LOC~~ HOSPITAL LAB): SARS Coronavirus 2: NEGATIVE

## 2018-11-05 LAB — LIPID PANEL
Cholesterol: 157 mg/dL (ref 0–200)
HDL: 46 mg/dL (ref >40)
LDL Cholesterol: 99 mg/dL (ref 0–99)
Total CHOL/HDL Ratio: 3.4 RATIO
Triglycerides: 62 mg/dL (ref <150)
VLDL: 12 mg/dL (ref 0–40)

## 2018-11-05 LAB — TROPONIN I (HIGH SENSITIVITY)
Troponin I (High Sensitivity): 345 ng/L (ref <18)
Troponin I (High Sensitivity): 712 ng/L (ref <18)
Troponin I (High Sensitivity): 884 ng/L (ref <18)
Troponin I (High Sensitivity): 1203 ng/L (ref <18)

## 2018-11-05 LAB — PROTIME-INR
INR: 1 (ref 0.8–1.2)
Prothrombin Time: 13 seconds (ref 11.4–15.2)

## 2018-11-05 LAB — ECHOCARDIOGRAM COMPLETE
Height: 77 in
Weight: 3200 oz

## 2018-11-05 LAB — APTT: aPTT: 31 seconds (ref 24–36)

## 2018-11-05 LAB — HEPARIN LEVEL (UNFRACTIONATED): Heparin Unfractionated: 0.39 IU/mL (ref 0.30–0.70)

## 2018-11-05 SURGERY — LEFT HEART CATH AND CORONARY ANGIOGRAPHY
Anesthesia: LOCAL

## 2018-11-05 MED ORDER — HEPARIN (PORCINE) IN NACL 1000-0.9 UT/500ML-% IV SOLN
INTRAVENOUS | Status: DC | PRN
  Administered 2018-11-05 (×2): 500 mL

## 2018-11-05 MED ORDER — HEPARIN (PORCINE) 25000 UT/250ML-% IV SOLN
1200.0000 [IU]/h | INTRAVENOUS | Status: DC
  Administered 2018-11-05: 1200 [IU]/h via INTRAVENOUS
  Filled 2018-11-05: qty 250

## 2018-11-05 MED ORDER — NITROGLYCERIN IN D5W 200-5 MCG/ML-% IV SOLN
5.0000 ug/min | INTRAVENOUS | Status: DC
  Administered 2018-11-05: 5 ug/min via INTRAVENOUS
  Filled 2018-11-05: qty 250

## 2018-11-05 MED ORDER — SODIUM CHLORIDE 0.9% FLUSH: 3.0000 mL | INTRAVENOUS | Status: DC | PRN

## 2018-11-05 MED ORDER — NITROGLYCERIN 0.4 MG SL SUBL: 0.4000 mg | SUBLINGUAL_TABLET | SUBLINGUAL | Status: DC | PRN

## 2018-11-05 MED ORDER — ACETAMINOPHEN 325 MG PO TABS
650.0000 mg | ORAL_TABLET | ORAL | Status: DC | PRN
  Administered 2018-11-05: 650 mg via ORAL
  Filled 2018-11-05: qty 2

## 2018-11-05 MED ORDER — SODIUM CHLORIDE 0.9 % WEIGHT BASED INFUSION
1.0000 mL/kg/h | INTRAVENOUS | Status: DC
  Administered 2018-11-05: 10:00:00 1 mL/kg/h via INTRAVENOUS

## 2018-11-05 MED ORDER — VERAPAMIL HCL 2.5 MG/ML IV SOLN
INTRAVENOUS | Status: AC
  Filled 2018-11-05: qty 2

## 2018-11-05 MED ORDER — MIDAZOLAM HCL 2 MG/2ML IJ SOLN
INTRAMUSCULAR | Status: DC | PRN
  Administered 2018-11-05: 1 mg via INTRAVENOUS

## 2018-11-05 MED ORDER — HEPARIN SODIUM (PORCINE) 1000 UNIT/ML IJ SOLN
INTRAMUSCULAR | Status: DC | PRN
  Administered 2018-11-05: 5000 [IU] via INTRAVENOUS

## 2018-11-05 MED ORDER — HEPARIN SODIUM (PORCINE) 1000 UNIT/ML IJ SOLN
INTRAMUSCULAR | Status: AC
  Filled 2018-11-05: qty 1

## 2018-11-05 MED ORDER — LIDOCAINE HCL (PF) 1 % IJ SOLN
INTRAMUSCULAR | Status: AC
  Filled 2018-11-05: qty 30

## 2018-11-05 MED ORDER — ACETAMINOPHEN 325 MG PO TABS: 650.0000 mg | ORAL_TABLET | Freq: Four times a day (QID) | ORAL | Status: DC | PRN

## 2018-11-05 MED ORDER — SODIUM CHLORIDE 0.9 % WEIGHT BASED INFUSION: 3.0000 mL/kg/h | INTRAVENOUS | Status: DC

## 2018-11-05 MED ORDER — VERAPAMIL HCL 2.5 MG/ML IV SOLN
INTRAVENOUS | Status: DC | PRN
  Administered 2018-11-05: 10 mL via INTRA_ARTERIAL

## 2018-11-05 MED ORDER — ASPIRIN 81 MG PO CHEW
81.0000 mg | CHEWABLE_TABLET | Freq: Every day | ORAL | Status: DC
  Administered 2018-11-05 – 2018-11-06 (×2): 81 mg via ORAL
  Filled 2018-11-05 (×2): qty 1

## 2018-11-05 MED ORDER — LIDOCAINE HCL (PF) 1 % IJ SOLN
INTRAMUSCULAR | Status: DC | PRN
  Administered 2018-11-05: 2 mL via INTRADERMAL

## 2018-11-05 MED ORDER — HYDRALAZINE HCL 20 MG/ML IJ SOLN: 10.0000 mg | INTRAMUSCULAR | Status: AC | PRN

## 2018-11-05 MED ORDER — MIDAZOLAM HCL 2 MG/2ML IJ SOLN
INTRAMUSCULAR | Status: AC
  Filled 2018-11-05: qty 2

## 2018-11-05 MED ORDER — LABETALOL HCL 5 MG/ML IV SOLN: 10.0000 mg | INTRAVENOUS | Status: AC | PRN

## 2018-11-05 MED ORDER — FENTANYL CITRATE (PF) 100 MCG/2ML IJ SOLN
INTRAMUSCULAR | Status: AC
  Filled 2018-11-05: qty 2

## 2018-11-05 MED ORDER — SODIUM CHLORIDE 0.9% FLUSH
3.0000 mL | Freq: Two times a day (BID) | INTRAVENOUS | Status: DC
  Administered 2018-11-05: 22:00:00 3 mL via INTRAVENOUS

## 2018-11-05 MED ORDER — SODIUM CHLORIDE 0.9 % IV SOLN
INTRAVENOUS | Status: AC
  Administered 2018-11-05: 14:00:00 via INTRAVENOUS

## 2018-11-05 MED ORDER — SODIUM CHLORIDE 0.9 % IV SOLN: 250.0000 mL | INTRAVENOUS | Status: DC | PRN

## 2018-11-05 MED ORDER — ONDANSETRON HCL 4 MG/2ML IJ SOLN: 4.0000 mg | Freq: Four times a day (QID) | INTRAMUSCULAR | Status: DC | PRN

## 2018-11-05 MED ORDER — ATORVASTATIN CALCIUM 80 MG PO TABS
80.0000 mg | ORAL_TABLET | Freq: Every day | ORAL | Status: DC
  Administered 2018-11-05: 18:00:00 80 mg via ORAL
  Filled 2018-11-05: qty 1

## 2018-11-05 MED ORDER — FENTANYL CITRATE (PF) 100 MCG/2ML IJ SOLN
INTRAMUSCULAR | Status: DC | PRN
  Administered 2018-11-05: 25 ug via INTRAVENOUS

## 2018-11-05 MED ORDER — SODIUM CHLORIDE 0.9% FLUSH
3.0000 mL | Freq: Two times a day (BID) | INTRAVENOUS | Status: DC
  Administered 2018-11-05: 18:00:00 3 mL via INTRAVENOUS

## 2018-11-05 MED ORDER — HEPARIN BOLUS VIA INFUSION
4000.0000 [IU] | Freq: Once | INTRAVENOUS | Status: AC
  Administered 2018-11-05: 02:00:00 4000 [IU] via INTRAVENOUS

## 2018-11-05 MED ORDER — HEPARIN (PORCINE) IN NACL 1000-0.9 UT/500ML-% IV SOLN
INTRAVENOUS | Status: AC
  Filled 2018-11-05: qty 1000

## 2018-11-05 MED ORDER — IOHEXOL 350 MG/ML SOLN
INTRAVENOUS | Status: DC | PRN
  Administered 2018-11-05: 14:00:00 120 mL via INTRA_ARTERIAL

## 2018-11-05 SURGICAL SUPPLY — 13 items
CATH INFINITI 5FR ANG PIGTAIL (CATHETERS) ×1 IMPLANT
CATH INFINITI 5FR JL4 (CATHETERS) ×1 IMPLANT
CATH OPTITORQUE TIG 4.0 5F (CATHETERS) ×1 IMPLANT
DEVICE RAD COMP TR BAND LRG (VASCULAR PRODUCTS) ×1 IMPLANT
GLIDESHEATH SLEND SS 6F .021 (SHEATH) ×1 IMPLANT
GUIDEWIRE INQWIRE 1.5J.035X260 (WIRE) IMPLANT
INQWIRE 1.5J .035X260CM (WIRE) ×2
KIT HEART LEFT (KITS) ×2 IMPLANT
PACK CARDIAC CATHETERIZATION (CUSTOM PROCEDURE TRAY) ×2 IMPLANT
SHEATH PROBE COVER 6X72 (BAG) ×1 IMPLANT
SYR MEDRAD MARK 7 150ML (SYRINGE) ×2 IMPLANT
TRANSDUCER W/STOPCOCK (MISCELLANEOUS) ×2 IMPLANT
TUBING CIL FLEX 10 FLL-RA (TUBING) ×2 IMPLANT

## 2018-11-05 NOTE — Progress Notes (Signed)
ANTICOAGULATION CONSULT NOTE - Preliminary  Pharmacy Consult for heparin Indication: chest pain/ACS  Allergies  Allergen Reactions  . No Known Allergies     Patient Measurements: Height: 6\' 5"  (195.6 cm) Weight: 200 lb (90.7 kg) IBW/kg (Calculated) : 89.1 HEPARIN DW (KG): 90.7   Vital Signs: Temp: 98.1 F (36.7 C) (09/22 2157) Temp Source: Oral (09/22 2157) BP: 116/74 (09/23 0000) Pulse Rate: 57 (09/23 0000)  Labs: Recent Labs    11/04/18 2231  HGB 13.7  HCT 42.9  PLT 243  CREATININE 0.95   Estimated Creatinine Clearance: 110.7 mL/min (by C-G formula based on SCr of 0.95 mg/dL).  Medical History: Past Medical History:  Diagnosis Date  . Arthritis   . Headache    occasional migraines treated with excedrin "a long time ago"  . PONV (postoperative nausea and vomiting)    after knee surgeries    Medications:  Infusions:  . heparin      Assessment: 58 yo male in ED with chest pain. Family history of heart disease.  Pharmacy has been asked to start heparin infusion.    Goal of Therapy:  Heparin level 0.3-0.7 units/ml   Plan:  Give 4000 units bolus x 1 Start heparin infusion at 1200 units/hr Check anti-Xa level in 6 hours and daily while on heparin Continue to monitor H&H and platelets Preliminary review of pertinent patient information completed.  Forestine Na clinical pharmacist will complete review during morning rounds to assess the patient and finalize treatment regimen.  Nyra Capes, RPH 11/05/2018,1:07 AM

## 2018-11-05 NOTE — ED Notes (Signed)
Electronic signature pad not working. Patient gave verbal consent to go to [facility] for cath.

## 2018-11-05 NOTE — ED Notes (Signed)
CRITICAL VALUE ALERT  Critical Value: Troponin 884  Date & Time Notied: 11/05/18 7185  Provider Notified: Rolland Porter, MD  Orders Received/Actions taken: n/a

## 2018-11-05 NOTE — H&P (Addendum)
History & Physical    Patient ID: Craig [name] MRN: [id number], DOB/AGE: 02/27/1960   Admit date: 11/04/2018  Primary Care Provider: Patient, No Pcp Per Primary Cardiologist: [facility] - Dr. Diona BrownerMcDowell  Chief Complaint: Chest Pain  Patient Profile    Craig [name] is a 58 y.o. male with past medical history of migraine headaches, prior tobacco use and family history of CAD who presented to [facility] ED on 11/04/2018 for evaluation of chest pain, found to have an NSTEMI. Being evaluated at the request of Dr. Lynelle DoctorKnapp.   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 Says that his wife who is a Publishing rights managernurse practitioner checked his pulse and it was ranging from the 30's to 130's and was overall concerning for atrial fibrillation but an EKG was not obtained and symptoms resolved within an hour.  He had been feeling back to baseline but last night while coaching and participating in half court drills, he developed sternal chest pressure. Describes this as something sitting in the middle of his chest. Reports associated significant diaphoresis but denies any dyspnea, nausea, or vomiting. No recent orthopnea, PND, or lower extremity edema.   He reports still having mild discomfort at this time and is currently on IV Heparin and IV NTG.  He denies any personal history of CAD or cardiac arrhythmias. Says that his mother had known atrial fibrillation and his dad had CABG in his 8350's. The patient denies any known HTN, HLD, or Type 2 DM. He is a former smoker but quit approximately 2 years ago.  Initial labs show WBC 15.2, Hgb 13.7, platelets 243, Na+ 137, K+ 4.0, and creatinine 0.95. COVID negative. Initial troponin elevated to 131 with repeat values at 345, 712, and 884. CXR with no acute cardiopulmonary findings.EKG shows NSR, HR 67, with RAD and upsloping ST segment along  inferior leads. No prior tracings available for comparison. Telemetry shows NSR with HR in the low-50's to 70's. No significant arrhythmias.    Past Medical History:  Diagnosis Date  . Arthritis   . Headache    occasional migraines treated with excedrin "a long time ago"  . PONV (postoperative nausea and vomiting)    after knee surgeries    Past Surgical History:  Procedure Laterality Date  . KNEE SURGERY     "bone spurr or cartilage operations", x6  . LUMBAR LAMINECTOMY/DECOMPRESSION MICRODISCECTOMY N/A 08/24/2015   Procedure: L3-4 CENTRAL DECOMPRESSION ;  Surgeon: Eldred MangesMark C Yates, MD;  [facility];  Service: Orthopedics;  Laterality: N/A;     Medications Prior to Admission: Prior to Admission medications   Medication Sig Start Date End Date Taking? Authorizing Provider  aspirin EC 81 MG tablet Take 31-325 mg by mouth daily as needed for mild pain or moderate pain.   Yes [provider]  Multiple Vitamin (MULTIVITAMIN) tablet Take 1 tablet by mouth daily. Patient takes 3-4 times a week "Daily advantage"   Yes [provider]     Allergies:    Allergies  Allergen Reactions  . No Known Allergies     Social History:   Social History   Socioeconomic History  . Marital status: Married    Spouse name: Not on file  . Number of children: Not on file  . Years of education: Not on file  . Highest education level: Not on  file  Occupational History  . Not on file  Social Needs  . Financial resource strain: Not on file  . Food insecurity    Worry: Not on file    Inability: Not on file  . Transportation needs    Medical: Not on file    Non-medical: Not on file  Tobacco Use  . Smoking status: Light Tobacco Smoker    Types: Cigars  . Smokeless tobacco: Never Used  . Tobacco comment: daily cigar 2x/day  Substance and Sexual Activity  . Alcohol use: No  . Drug use: No  . Sexual activity: Not on file  Lifestyle  . Physical activity    Days per week: Not  on file    Minutes per session: Not on file  . Stress: Not on file  Relationships  . Social Musician on phone: Not on file    Gets together: Not on file    Attends religious service: Not on file    Active member of club or organization: Not on file    Attends meetings of clubs or organizations: Not on file    Relationship status: Not on file  . Intimate partner violence    Fear of current or ex partner: Not on file    Emotionally abused: Not on file    Physically abused: Not on file    Forced sexual activity: Not on file  Other Topics Concern  . Not on file  Social History Narrative  . Not on file      Family History:   The patient's family history includes Atrial fibrillation in his mother; Coronary artery disease in his father.     Review of Systems    General:  No chills, fever, night sweats or weight changes.  Cardiovascular:  No dyspnea on exertion, edema, orthopnea, palpitations, paroxysmal nocturnal dyspnea. Positive for chest pain. Dermatological: No rash, lesions/masses Respiratory: No cough, dyspnea Urologic: No hematuria, dysuria Abdominal:   No nausea, vomiting, diarrhea, bright red blood per rectum, melena, or hematemesis Neurologic:  No visual changes, wkns, changes in mental status. All other systems reviewed and are otherwise negative except as noted above.  Physical Exam    Vitals:   11/05/18 0300 11/05/18 0706 11/05/18 0730 11/05/18 0800  BP: 121/82 111/66 103/61 118/74  Pulse: (!) 59 61 61 (!) 58  Resp: 14 17 18 11   Temp:      TempSrc:      SpO2: 98% 99% 96% 99%  Weight:      Height:       No intake or output data in the 24 hours ending 11/05/18 0833 Filed Weights   11/04/18 2155  Weight: 90.7 kg   Body mass index is 23.72 kg/m.   General: Well developed, well nourished,male in no acute distress. Head: Normocephalic, atraumatic, sclera non-icteric, no xanthomas, nares are without discharge. Neck: No carotid bruits. JVD not  elevated.  Lungs: Respirations regular and unlabored, without wheezes or rales.  Heart: Regular rate and rhythm. No S3 or S4.  No murmur, no rubs, or gallops appreciated. Abdomen: Soft, non-tender, non-distended with normoactive bowel sounds. No hepatomegaly. No rebound/guarding. No obvious abdominal masses. Msk:  Strength and tone appear normal for age. No joint deformities or effusions. Extremities: No clubbing or cyanosis. No edema.  Distal pedal pulses are 2+ bilaterally. Neuro: Alert and oriented X 3. Moves all extremities spontaneously. No focal deficits noted. Psych:  Responds to questions appropriately with a normal affect. Skin: No rashes  or lesions noted  Labs and Radiology Studies    EKG:  The ECG that was done  was personally reviewed and demonstrates NSR, HR 67, with RAD and upsloping ST segment along inferior leads. No prior tracings available for comparison.    Relevant CV Studies:  None on File  Laboratory Data:  Chemistry Recent Labs  Lab 11/04/18 2231  NA 137  K 4.0  CL 103  CO2 23  GLUCOSE 110*  BUN 19  CREATININE 0.95  CALCIUM 9.1  GFRNONAA >60  GFRAA >60  ANIONGAP 11    No results for input(s): PROT, ALBUMIN, AST, ALT, ALKPHOS, BILITOT in the last 168 hours. Hematology Recent Labs  Lab 11/04/18 2231  WBC 15.2*  RBC 4.74  HGB 13.7  HCT 42.9  MCV 90.5  MCH 28.9  MCHC 31.9  RDW 14.2  PLT 243   Cardiac EnzymesNo results for input(s): TROPONINI in the last 168 hours. No results for input(s): TROPIPOC in the last 168 hours.  BNPNo results for input(s): BNP, PROBNP in the last 168 hours.  DDimer No results for input(s): DDIMER in the last 168 hours.  Radiology/Studies:  Dg Chest 2 View  Result Date: 11/04/2018 CLINICAL DATA:  Chest pain onset today. EXAM: CHEST - 2 VIEW COMPARISON:  Radiograph 08/22/2015 FINDINGS: The cardiomediastinal contours are normal. The lungs are clear. Pulmonary vasculature is normal. No consolidation, pleural effusion,  or pneumothorax. No acute osseous abnormalities are seen. IMPRESSION: No acute chest findings. Electronically Signed   By: Keith Rake M.D.   On: 11/04/2018 22:42    Assessment and Plan:   1. NSTEMI - presented with sternal chest pressure which started while coaching basketball. Cardiac risk factors include family history of CAD (father required CABG in his 53's) and former tobacco use. Will check FLP and Hgb A1c.  - Initial troponin elevated to 131 with repeat values at 345, 712, and 884. EKG shows NSR, HR 67, with RAD and upsloping ST segment along inferior leads. No prior tracings available for comparison.  - Given his presentation and elevated enzymes, will plan to proceed with cardiac catheterization for definitive evaluation. The patient understands that risks include but are not limited to stroke (1 in 1000), death (1 in 5), kidney failure [usually temporary] (1 in 500), bleeding (1 in 200), allergic reaction [possibly serious] (1 in 200). Added to the cath board for later today.   - continue Heparin and IV NTG. Start ASA 81mg  daily and high-intensity statin therapy. Would hold off on BB therapy for now as HR is currently in the low-50's.   2. Palpitations - occurring 3 weeks prior to admission with HR ranging from the 30's to 130's per patient's report. If telemetry unrevealing this admission, would consider a monitor as an outpatient if recurrence of symptoms.   For questions or updates, please contact [facility] Please consult [email or web address] for contact info under Cardiology/STEMI.   Signed, Erma Heritage, PA-C 11/05/2018, 8:33 AM Pager: [phone]   Attending note:  Patient seen and examined.  I reviewed his records and discussed the case with Ms. Ahmed Prima PA-C.  Craig [name] is a 58 year old male with family history of premature CAD in his father, former tobacco abuse, but no known major medical conditions otherwise.  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  Within the last few weeks he has experienced a sense of palpitations, but more recently an episode of chest tightness and significant  diaphoresis that occurred while he was coaching.  He presented to the ER for evaluation with overall nonspecific ECG and high-sensitivity troponin I levels increasing from 131 gradually up to 884, chest x-ray without acute findings.  He has been treated with aspirin, IV heparin, and IV nitroglycerin.  Reports mild residual chest discomfort but otherwise has been hemodynamically stable.  On examination he appears comfortable.  He is afebrile, heart rate in the low 50s and sinus rhythm by telemetry which I personally reviewed.  Systolic blood pressure ranging 1 10-1 20.  Lungs are clear.  Cardiac exam reveals RRR without gallop or rub.  Lab work shows potassium 4.0, BUN 19, creatinine 0.95, hemoglobin 13.7, platelets 243, SARS coronavirus 2 test negative.  Unstable angina symptoms with evidence of NSTEMI.  Continue aspirin, IV heparin, and IV nitroglycerin.  Also adding statin.  Beta-blocker held with patient in sinus bradycardia in the low 50s.  Plan is for transfer to [facility] for diagnostic cardiac catheterization and potential PCI.  Risks and benefits discussed and he is in agreement to proceed.  Jonelle Sidle, M.D., F.A.C.C.

## 2018-11-05 NOTE — Progress Notes (Signed)
TR BAND REMOVAL  LOCATION:  right radial  DEFLATED PER PROTOCOL:  Yes.    TIME BAND OFF / DRESSING APPLIED:   1800   SITE UPON ARRIVAL:   Level 0  SITE AFTER BAND REMOVAL:  Level 0  CIRCULATION SENSATION AND MOVEMENT:  Within Normal Limits  Yes.    COMMENTS:    

## 2018-11-05 NOTE — Progress Notes (Signed)
  Echocardiogram 2D Echocardiogram has been performed.  Craig [name] 11/05/2018, 3:45 PM

## 2018-11-05 NOTE — ED Provider Notes (Addendum)
[facility] EMERGENCY DEPARTMENT Provider Note   CSN: [id number] Arrival date & time: 11/04/18  2141   Time seen 23:19 PM  History   Chief Complaint Chief Complaint  Patient presents with  . Chest Pain    HPI Craig [name] is a 58 y.o. male.     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  1 time last week he had palpitations and came home and had a irregular heartbeat.  He did not seek any medical care at that time.  At 6:30 PM tonight after coaching for about an hour and a half and helping the boys with the drills he started getting a dull burning tight central chest pain that did not radiate.  He denies having acid fluid in his throat.  He states he got very sweaty but denies shortness of breath, nausea, vomiting, or palpitations tonight.  He states he is never had this happen before.  He did take 2 aspirin at 7:30 PM.  Patient has a family history of heart disease, his father had a heart attack at about his age and has open heart surgery afterwards.  His paternal grandmother also had coronary artery disease.  His mother had a history of stroke and atrial fib.  He states he quit smoking years ago.  He denies any history of hypertension, diabetes, high cholesterol.  When I first went in the room he told me he still had some mild discomfort however during the course of our conversation he states his discomfort was gone.  Wife reports that he was still complaining of pain in the car on the way coming to the ED.  PCP Patient, No Pcp Per   Past Medical History:  Diagnosis Date  . Arthritis   . Headache    occasional migraines treated with excedrin "a long time ago"  . PONV (postoperative nausea and vomiting)    after knee surgeries    Patient Active Problem List   Diagnosis Date Noted  . NSTEMI (non-ST elevated myocardial infarction) (HCC) 11/05/2018  . Lumbar stenosis 08/24/2015    Past Surgical History:  Procedure  Laterality Date  . KNEE SURGERY     "bone spurr or cartilage operations", x6  . LUMBAR LAMINECTOMY/DECOMPRESSION MICRODISCECTOMY N/A 08/24/2015   Procedure: L3-4 CENTRAL DECOMPRESSION ;  Surgeon: Eldred Manges, MD;  Location: [facility] OR;  Service: Orthopedics;  Laterality: N/A;        Home Medications    Prior to Admission medications   Medication Sig Start Date End Date Taking? Authorizing Provider  aspirin EC 81 MG tablet Take 31-325 mg by mouth daily as needed for mild pain or moderate pain.   Yes [provider]  Multiple Vitamin (MULTIVITAMIN) tablet Take 1 tablet by mouth daily. Patient takes 3-4 times a week "Daily advantage"   Yes [provider]    Family History History reviewed. No pertinent family history.  Social History Social History   Tobacco Use  . Smoking status: Light Tobacco Smoker    Types: Cigars  . Smokeless tobacco: Never Used  . Tobacco comment: daily cigar 2x/day  Substance Use Topics  . Alcohol use: No  . 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    Physical Exam Updated Vital Signs BP 121/82   Pulse (!) 59  Temp 97.7 F (36.5 C) (Oral)   Resp 14   Ht 6\' 5"  (1.956 m)   Wt 90.7 kg   SpO2 98%   BMI 23.72 kg/m   Physical Exam Vitals signs and nursing note reviewed.  Constitutional:      General: He is not in acute distress.    Appearance: Normal appearance. He is well-developed. He is not ill-appearing or toxic-appearing.  HENT:     Head: Normocephalic and atraumatic.     Right Ear: External ear normal.     Left Ear: External ear normal.     Nose: Nose normal. No mucosal edema or rhinorrhea.     Mouth/Throat:     Dentition: No dental abscesses.     Pharynx: No uvula swelling.  Eyes:     Conjunctiva/sclera: Conjunctivae normal.     Pupils: Pupils are equal, round, and reactive to light.  Neck:      Musculoskeletal: Full passive range of motion without pain, normal range of motion and neck supple.  Cardiovascular:     Rate and Rhythm: Normal rate and regular rhythm.     Heart sounds: Normal heart sounds. No murmur. No friction rub. No gallop.   Pulmonary:     Effort: Pulmonary effort is normal. No respiratory distress.     Breath sounds: Normal breath sounds. No wheezing, rhonchi or rales.  Chest:     Chest wall: No tenderness or crepitus.    Abdominal:     General: Bowel sounds are normal. There is no distension.     Palpations: Abdomen is soft.     Tenderness: There is no abdominal tenderness. There is no guarding or rebound.  Musculoskeletal: Normal range of motion.        General: No tenderness.     Comments: Moves all extremities well.   Skin:    General: Skin is warm and dry.     Capillary Refill: Capillary refill takes less than 2 seconds.     Coloration: Skin is not pale.     Findings: No erythema or rash.  Neurological:     General: No focal deficit present.     Mental Status: He is alert and oriented to person, place, and time.     Cranial Nerves: No cranial nerve deficit.  Psychiatric:        Mood and Affect: Mood normal. Mood is not anxious.        Speech: Speech normal.        Behavior: Behavior normal.        Thought Content: Thought content normal.      ED Treatments / Results  Labs (all labs ordered are listed, but only abnormal results are displayed) Results for orders placed or performed during the hospital encounter of 11/04/18  SARS Coronavirus 2 [facility](Hospital order, Performed in [facility] hospital lab) Nasopharyngeal Nasopharyngeal Swab   Specimen: Nasopharyngeal Swab  Result Value Ref Range   SARS Coronavirus 2 NEGATIVE NEGATIVE  Basic metabolic panel  Result Value Ref Range   Sodium 137 135 - 145 mmol/L   Potassium 4.0 3.5 - 5.1 mmol/L   Chloride 103 98 - 111 mmol/L   CO2 23 22 - 32 mmol/L   Glucose, Bld 110 (H) 70 - 99 mg/dL   BUN 19 6 -  20 mg/dL   Creatinine, Ser 1.610.95 0.61 - 1.24 mg/dL   Calcium 9.1 8.9 - 09.610.3 mg/dL   GFR calc non Af Amer >60 >60 mL/min   GFR calc Af  Amer >60 >60 mL/min   Anion gap 11 5 - 15  CBC  Result Value Ref Range   WBC 15.2 (H) 4.0 - 10.5 K/uL   RBC 4.74 4.22 - 5.81 MIL/uL   Hemoglobin 13.7 13.0 - 17.0 g/dL   HCT 42.9 39.0 - 52.0 %   MCV 90.5 80.0 - 100.0 fL   MCH 28.9 26.0 - 34.0 pg   MCHC 31.9 30.0 - 36.0 g/dL   RDW 14.2 11.5 - 15.5 %   Platelets 243 150 - 400 K/uL   nRBC 0.0 0.0 - 0.2 %  Protime-INR  Result Value Ref Range   Prothrombin Time 13.0 11.4 - 15.2 seconds   INR 1.0 0.8 - 1.2  APTT  Result Value Ref Range   aPTT 31 24 - 36 seconds  Troponin I (High Sensitivity)  Result Value Ref Range   Troponin I (High Sensitivity) 131 (HH) <18 ng/L  Troponin I (High Sensitivity)  Result Value Ref Range   Troponin I (High Sensitivity) 345 (HH) <18 ng/L  Troponin I (High Sensitivity)  Result Value Ref Range   Troponin I (High Sensitivity) 712 (HH) <18 ng/L   Laboratory interpretation all normal except elevated troponin with positive delta troponin change, leukocytosis    EKG None   ED ECG REPORT # 1 21:55 PM    Date: 11/05/2018  Rate: 67  Rhythm: normal sinus rhythm  QRS Axis: right  Intervals: normal  ST/T Wave abnormalities: peaked T waves inf-lat  Conduction Disutrbances:none  Narrative Interpretation:   Old EKG Reviewed: none available  I have personally reviewed the EKG tracing and agree with the computerized printout as noted.    # 2  ED ECG REPORT  01:19 AM   Date: 11/05/2018  Rate: 61  Rhythm: normal sinus rhythm  QRS Axis: normal  Intervals: normal  ST/T Wave abnormalities: Peaked T waves inf-lat  Conduction Disutrbances:none  Narrative Interpretation:   Old EKG Reviewed: unchanged  I have personally reviewed the EKG tracing and agree with the computerized printout as noted.      Radiology Dg Chest 2 View  Result Date: 11/04/2018 CLINICAL  DATA:  Chest pain onset today. EXAM: CHEST - 2 VIEW COMPARISON:  Radiograph 08/22/2015 FINDINGS: The cardiomediastinal contours are normal. The lungs are clear. Pulmonary vasculature is normal. No consolidation, pleural effusion, or pneumothorax. No acute osseous abnormalities are seen. IMPRESSION: No acute chest findings. Electronically Signed   By: Keith Rake M.D.   On: 11/04/2018 22:42    Procedures .Critical Care Performed by: Rolland Porter, MD Authorized by: Rolland Porter, MD   Critical care provider statement:    Critical care time (minutes):  39   Critical care was necessary to treat or prevent imminent or life-threatening deterioration of the following conditions:  Cardiac failure   Critical care was time spent personally by me on the following activities:  Discussions with consultants, examination of patient, obtaining history from patient or surrogate, ordering and review of laboratory studies, ordering and review of radiographic studies, pulse oximetry and re-evaluation of patient's condition   (including critical care time)  Medications Ordered in ED Medications  sodium chloride flush (NS) 0.9 % injection 3 mL (has no administration in time range)  heparin bolus via infusion 4,000 Units (4,000 Units Intravenous Bolus from Bag 11/05/18 0132)    Followed by  heparin ADULT infusion 100 units/mL (25000 units/225mL sodium chloride 0.45%) (1,200 Units/hr Intravenous New Bag/Given 11/05/18 0136)  nitroGLYCERIN 50 mg in dextrose 5 % 250  mL (0.2 mg/mL) infusion (25 mcg/min Intravenous Rate/Dose Change 11/05/18 0327)  nitroGLYCERIN (NITROGLYN) 2 % ointment 1 inch (1 inch Topical Given 11/04/18 2339)  acetaminophen (TYLENOL) tablet 1,000 mg (1,000 mg Oral Given 11/04/18 2339)     Initial Impression / Assessment and Plan / ED Course  I have reviewed the triage vital signs and the nursing notes.  Pertinent labs & imaging results that were available during my care of the patient were reviewed  by me and considered in my medical decision making (see chart for details).      I had pharmacy go ahead and start him on heparin bolus and drip.  I reviewed his delta troponin and the second troponin is almost double.  Cardiology consult was ordered.  Recheck at 1:15 AM patient's wife is no longer in the room.  I discussed with him his troponin is even higher.  He states his chest feels tight again but cannot tell me when it started.  His nitroglycerin paste was stopped and he was started on a nitroglycerin drip.  1:29 AM Dr Santiago Glad, Cardiology Fellow, accepts in transfer to [facility] to [facility], attending Dr Eldridge Dace  Patient verifies he took 2 regular strength aspirin at 7:30 PM  Recheck at 2:50 AM patient states his chest is starting to feel more tight.  His nurse was notified to increase his nitroglycerin drip.  5:00 AM patient's third troponin is in the 700s.  I am waiting to the room and tried to awaken the patient.  I even shook his toe but he did not wake up.  He is continuing on a heparin and nitroglycerin drip.  I am going to discuss him with the cardiology fellow again.  5:16 AM Dr Lianne Bushy, given update about patient.  He feels the elevation of the troponin is normal and expected.  He has been unable to see his EKGs, I took a picture with haiku and put it in the chart.  At change of shift Dr. Juleen China was made aware of patient and that he was waiting for a bed to be available at [facility].  Final Clinical Impressions(s) / ED Diagnoses   Final diagnoses:  NSTEMI (non-ST elevated myocardial infarction) [facility])    Plan admission  Devoria Albe, MD, Concha Pyo, MD 11/05/18 6314    Devoria Albe, MD 11/05/18 [phone]

## 2018-11-05 NOTE — Interval H&P Note (Signed)
History and Physical Interval Note:  11/05/2018 2:18 PM  Craig [name]  has presented today for surgery, with the diagnosis of Nonstemi.  The various methods of treatment have been discussed with the patient and family. After consideration of risks, benefits and other options for treatment, the patient has consented to  Procedure(s): LEFT HEART CATH AND CORONARY ANGIOGRAPHY (N/A)  PERCUTANEOUS CORONARY INTERVENTION  as a surgical intervention.  The patient's history has been reviewed, patient examined, no change in status, stable for surgery.  I have reviewed the patient's chart and labs.  Questions were answered to the patient's satisfaction.    Cath Lab Visit (complete for each Cath Lab visit)  Clinical Evaluation Leading to the Procedure:   ACS: Yes.    Non-ACS:    Anginal Classification: CCS III  Anti-ischemic medical therapy: Maximal Therapy (2 or more classes of medications)  Non-Invasive Test Results: No non-invasive testing performed  Prior CABG: No previous CABG         Glenetta Hew

## 2018-11-05 NOTE — Progress Notes (Signed)
ANTICOAGULATION CONSULT NOTE -   Pharmacy Consult for heparin Indication: chest pain/ACS  Allergies  Allergen Reactions  . No Known Allergies     Patient Measurements: Height: 6\' 5"  (195.6 cm) Weight: 200 lb (90.7 kg) IBW/kg (Calculated) : 89.1 HEPARIN DW (KG): 90.7   Vital Signs: Temp: 97.7 F (36.5 C) (09/23 0157) Temp Source: Oral (09/23 0157) BP: 103/61 (09/23 0730) Pulse Rate: 61 (09/23 0730)  Labs: Recent Labs    11/04/18 2231 11/05/18 0738  HGB 13.7  --   HCT 42.9  --   PLT 243  --   APTT 31  --   LABPROT 13.0  --   INR 1.0  --   HEPARINUNFRC  --  0.39  CREATININE 0.95  --    Estimated Creatinine Clearance: 110.7 mL/min (by C-G formula based on SCr of 0.95 mg/dL).  Medical History: Past Medical History:  Diagnosis Date  . Arthritis   . Headache    occasional migraines treated with excedrin "a long time ago"  . PONV (postoperative nausea and vomiting)    after knee surgeries    Medications:  Infusions:  . heparin 1,200 Units/hr (11/05/18 0136)  . nitroGLYCERIN 25 mcg/min (11/05/18 0327)    Assessment: 58 yo male in ED with chest pain. Family history of heart disease.  Pharmacy has been asked to start heparin infusion.    HL 0.39, therapeutic.   Goal of Therapy:  Heparin level 0.3-0.7 units/ml   Plan:  Continue heparin infusion at 1200 units/hr Check anti-Xa level in 6 hours and daily while on heparin Continue to monitor H&H and platelets  Craig [name], BS Craig [name], BCPS Clinical Pharmacist Pager [phone] 11/05/2018,8:21 AM

## 2018-11-06 ENCOUNTER — Encounter (HOSPITAL_COMMUNITY): Payer: Self-pay | Admitting: Cardiology

## 2018-11-06 DIAGNOSIS — R7989 Other specified abnormal findings of blood chemistry: Secondary | ICD-10-CM

## 2018-11-06 LAB — BASIC METABOLIC PANEL
Anion gap: 9 (ref 5–15)
BUN: 13 mg/dL (ref 6–20)
CO2: 24 mmol/L (ref 22–32)
Calcium: 9 mg/dL (ref 8.9–10.3)
Chloride: 107 mmol/L (ref 98–111)
Creatinine, Ser: 0.85 mg/dL (ref 0.61–1.24)
GFR calc Af Amer: >60 mL/min (ref >60)
GFR calc non Af Amer: >60 mL/min (ref >60)
Glucose, Bld: 88 mg/dL (ref 70–99)
Potassium: 3.9 mmol/L (ref 3.5–5.1)
Sodium: 140 mmol/L (ref 135–145)

## 2018-11-06 LAB — HEMOGLOBIN A1C
Hgb A1c MFr Bld: 5.2 % (ref 4.8–5.6)
Mean Plasma Glucose: 103 mg/dL

## 2018-11-06 LAB — CBC
HCT: 41 % (ref 39.0–52.0)
Hemoglobin: 13.4 g/dL (ref 13.0–17.0)
MCH: 29.4 pg (ref 26.0–34.0)
MCHC: 32.7 g/dL (ref 30.0–36.0)
MCV: 89.9 fL (ref 80.0–100.0)
Platelets: 228 10*3/uL (ref 150–400)
RBC: 4.56 MIL/uL (ref 4.22–5.81)
RDW: 14.3 % (ref 11.5–15.5)
WBC: 9.8 10*3/uL (ref 4.0–10.5)
nRBC: 0 % (ref 0.0–0.2)

## 2018-11-06 LAB — HIV ANTIBODY (ROUTINE TESTING W REFLEX): HIV Screen 4th Generation wRfx: NONREACTIVE

## 2018-11-06 MED ORDER — ATORVASTATIN CALCIUM 80 MG PO TABS: 80.0000 mg | ORAL_TABLET | Freq: Every day | ORAL | 6 refills | Status: DC

## 2018-11-06 MED ORDER — NITROGLYCERIN 0.4 MG SL SUBL: 0.4000 mg | SUBLINGUAL_TABLET | SUBLINGUAL | 3 refills | Status: DC | PRN

## 2018-11-06 MED ORDER — ASPIRIN EC 81 MG PO TBEC: 81.0000 mg | DELAYED_RELEASE_TABLET | Freq: Every day | ORAL | Status: DC

## 2018-11-06 MED FILL — NITROGLYCERIN 0.4 MG TAB SL: 0.4 | 8 days supply | Qty: 25 | Fill #0

## 2018-11-06 MED FILL — ATORVASTATIN CALCIUM 80 MG: 80 | 30 days supply | Qty: 30 | Fill #0

## 2018-11-06 NOTE — Progress Notes (Signed)
Progress Note  Patient Name: Craig [name] Date of Encounter: 11/06/2018  Primary Cardiologist: Rozann Lesches, MD  Subjective   Chest tightness finally resolved yesterday afternoon.  Had been active prior to onset of CP, no palps About 10 days ago, had episode > 1 hr palpitations, HR up to 130 Mother had a stroke Father had CABG age 56 Sister had pericarditis, he wonder if he had that but no change in pain w/ deep inspiration or leaning forward He gets leg cramps during/after exercise  Inpatient Medications    Scheduled Meds: . aspirin  81 mg Oral Daily  . atorvastatin  80 mg Oral q1800  . sodium chloride flush  3 mL Intravenous Once  . sodium chloride flush  3 mL Intravenous Q12H  . sodium chloride flush  3 mL Intravenous Q12H   Continuous Infusions: . sodium chloride     PRN Meds: sodium chloride, acetaminophen, nitroGLYCERIN, ondansetron (ZOFRAN) IV, sodium chloride flush   Vital Signs    Vitals:   11/05/18 1635 11/05/18 1705 11/05/18 2115 11/06/18 0609  BP: 119/69 108/68 113/66 115/72  Pulse: (!) 56 (!) 57 63 60  Resp:   18 20  Temp:   98.4 F (36.9 C) 98 F (36.7 C)  TempSrc:   Oral Oral  SpO2: 98% 96% 96% 97%  Weight:    89 kg  Height:        Intake/Output Summary (Last 24 hours) at 11/06/2018 0752 Last data filed at 11/05/2018 1830 Gross per 24 hour  Intake 307.75 ml  Output -  Net 307.75 ml   Filed Weights   11/04/18 2155 11/06/18 0609  Weight: 90.7 kg 89 kg   Last Weight  Most recent update: 11/06/2018  6:21 AM   Weight  89 kg (196 lb 3.4 oz)           Weight change: -1.719 kg   Telemetry    SR, S brady high 40s - Personally Reviewed  ECG    None today - Personally Reviewed  Physical Exam   General: Well developed, well nourished, male appearing in no acute distress. Head: Normocephalic, atraumatic.  Neck: Supple without bruits, JVD not elevated. Lungs:  Resp regular and unlabored, CTA. Heart: RRR, S1, S2, no S3, S4, or  murmur; no rub. Abdomen: Soft, non-tender, non-distended with normoactive bowel sounds. No hepatomegaly. No rebound/guarding. No obvious abdominal masses. Extremities: No clubbing, cyanosis, no edema. Distal pedal pulses are 2+ bilaterally. R radial cath site w/out ecchymosis or hematoma Neuro: Alert and oriented X 3. Moves all extremities spontaneously. Psych: Normal affect.  Labs    Hematology Recent Labs  Lab 11/04/18 2231 11/06/18 0305  WBC 15.2* 9.8  RBC 4.74 4.56  HGB 13.7 13.4  HCT 42.9 41.0  MCV 90.5 89.9  MCH 28.9 29.4  MCHC 31.9 32.7  RDW 14.2 14.3  PLT 243 228    Chemistry Recent Labs  Lab 11/04/18 2231 11/06/18 0305  NA 137 140  K 4.0 3.9  CL 103 107  CO2 23 24  GLUCOSE 110* 88  BUN 19 13  CREATININE 0.95 0.85  CALCIUM 9.1 9.0  GFRNONAA >60 >60  GFRAA >60 >60  ANIONGAP 11 9     High Sensitivity Troponin:   Recent Labs  Lab 11/04/18 2231 11/05/18 0012 11/05/18 0348 11/05/18 0554 11/05/18 0902  TROPONINIHS 131* 345* 712* 884* 1,203*   No results found for: TSH   Radiology    Dg Chest 2 View  Result Date: 11/04/2018  CLINICAL DATA:  Chest pain onset today. EXAM: CHEST - 2 VIEW COMPARISON:  Radiograph 08/22/2015 FINDINGS: The cardiomediastinal contours are normal. The lungs are clear. Pulmonary vasculature is normal. No consolidation, pleural effusion, or pneumothorax. No acute osseous abnormalities are seen. IMPRESSION: No acute chest findings. Electronically Signed   By: Narda Rutherford M.D.   On: 11/04/2018 22:42     Cardiac Studies   CARDIAC CATH: 11/04/2018  The left ventricular systolic function is normal.  LV end diastolic pressure is normal.  The left ventricular ejection fraction is 55-65% by visual estimate.  There is no aortic valve stenosis.  There is no mitral valve regurgitation.   SUMMARY  Angiographically normal coronary arteries  Normal left ventricular function with normal filling pressures.    RECOMMENDATIONS  Would look for non-coronary artery disease related reason for troponin elevation, consider pericarditis.  Otherwise would be stable for discharge from cardiac standpoint either the second tomorrow morning.    ECHO:  11/04/2018  1. Left ventricular ejection fraction, by visual estimation, is 60 to 65%. The left ventricle has normal function. Normal left ventricular size. There is no left ventricular hypertrophy.  2. Left ventricular diastolic Doppler parameters are indeterminate pattern of LV diastolic filling.  3. Global right ventricle has normal systolic function.The right ventricular size is normal. No increase in right ventricular wall thickness.  4. Left atrial size was normal.  5. Right atrial size was normal.  6. The mitral valve is normal in structure. Trace mitral valve regurgitation. No evidence of mitral stenosis.  7. The tricuspid valve is normal in structure. Tricuspid valve regurgitation was not visualized by color flow Doppler.  8. The aortic valve is normal in structure. Aortic valve regurgitation was not visualized by color flow Doppler. Mild aortic valve sclerosis without stenosis.  9. The pulmonic valve was normal in structure. Pulmonic valve regurgitation is not visualized by color flow Doppler. 10. The inferior vena cava is dilated in size with <50% respiratory variability, suggesting right atrial pressure of 15 mmHg.  Patient Profile     58 y.o. male w/ hx migraines, tob use, FH CAD was came to [facility] 09/22 for CP>>NSTEMI by ez>>tx [facility] for cath.   Assessment & Plan    1. NSTEMI - peak HS trop 1203 but w/ nl cors at cath - not clearly pericarditis - did have a little burning with the tightness, ?GI - advised him that no life-threatening cause of pain found - f/u as outpt  2. Palpitations - had one episode about 10 days ago, lasted over an hour, did not seek help - wife (NP) checked HR w/ pulse-ox, it ranged from 35-130 - if has more episodes,  advised him to seek help immediately to figure out what this is - told him about ALIVECOR monitors - MD advise if event monitor needed - he is worried about Afib, mother had a bad stroke from that  Plan: ambulate and see how tolerated, dc today.   Active Problems:   NSTEMI (non-ST elevated myocardial infarction) (HCC)    Signed, Theodore Demark , PA-C 7:52 AM 11/06/2018 Pager: [phone]

## 2018-11-06 NOTE — Discharge Instructions (Signed)

## 2018-11-06 NOTE — Discharge Summary (Signed)
Discharge Summary    Patient ID: Craig [name],  MRN: [id number], DOB/AGE: 1960/10/18 58 y.o.  Admit date: 11/04/2018 Discharge date: 11/06/2018  Primary Care Provider: Patient, No Pcp Per Primary Cardiologist: Craig Lesches, MD   Discharge Diagnoses    Principal Problem:   Elevated troponin level not due to acute coronary syndrome   Allergies Allergies  Allergen Reactions  . No Known Allergies     Diagnostic Studies/Procedures    CARDIAC CATH: 11/04/2018  The left ventricular systolic function is normal.  LV end diastolic pressure is normal.  The left ventricular ejection fraction is 55-65% by visual estimate.  There is no aortic valve stenosis.  There is no mitral valve regurgitation.  SUMMARY  Angiographically normal coronary arteries  Normal left ventricular function with normal filling pressures.   RECOMMENDATIONS  Would look for non-coronary artery disease related reason for troponin elevation, consider pericarditis.  Otherwise would be stable for discharge from cardiac standpoint either the second tomorrow morning.  ECHO:  11/04/2018 1. Left ventricular ejection fraction, by visual estimation, is 60 to 65%. The left ventricle has normal function. Normal left ventricular size. There is no left ventricular hypertrophy. 2. Left ventricular diastolic Doppler parameters are indeterminate pattern of LV diastolic filling. 3. Global right ventricle has normal systolic function.The right ventricular size is normal. No increase in right ventricular wall thickness. 4. Left atrial size was normal. 5. Right atrial size was normal. 6. The mitral valve is normal in structure. Trace mitral valve regurgitation. No evidence of mitral stenosis. 7. The tricuspid valve is normal in structure. Tricuspid valve regurgitation was not visualized by color flow Doppler. 8. The aortic valve is normal in structure. Aortic valve regurgitation was not visualized  by color flow Doppler. Mild aortic valve sclerosis without stenosis. 9. The pulmonic valve was normal in structure. Pulmonic valve regurgitation is not visualized by color flow Doppler. 10. The inferior vena cava is dilated in size with <50% respiratory variability, suggesting right atrial pressure of 15 mmHg.  _____________   History of Present Illness     58 y.o. male w/ hx migraines, tob use, FH CAD was came to [facility] 09/22 for CP>>NSTEMI by ez>>tx [facility] for cath.   Hospital Course     Consultants: None   Mr. Craig [name] was taken to the Cath Lab, results are above.  He had no coronary artery disease, and no evidence of spasm.  Because first troponin elevation is unclear, but it is not due to a non-STEMI or ACS.  By ECG and history, the cause for his chest pain was not clearly pericarditis, may be myocarditis. Therefore, he will get high-dose statin and prn nitrates at discharge.  He had palpitations about 10 days prior to his chest pain episode, heart rate range 35-130 by pulse ox.  He did not seek help or get an EKG.  He did not have palpitations on the day of admission.  He was in sinus rhythm on telemetry.  He was concerned about atrial fibrillation because his mother had it and had a bad stroke because of it.  He is to follow-up with Dr. Domenic [name] after discharge.  He was told about Alivecor monitors, or may need an event monitor after discharge.  On 9/24, he was seen by Dr. Debara [name] and all data were reviewed.  No further inpatient work-up is indicated and he is considered stable for discharge, to follow-up as an outpatient.  _____________  Discharge Vitals Blood pressure 115/72, pulse 60, temperature  98 F (36.7 C), temperature source Oral, resp. rate 20, height 6\' 5"  (1.956 m), weight 89 kg, SpO2 97 %.  Filed Weights   11/04/18 2155 11/06/18 0609  Weight: 90.7 kg 89 kg    Labs & Radiologic Studies    CBC Recent Labs    11/04/18 2231 11/06/18 0305  WBC 15.2* 9.8  HGB 13.7  13.4  HCT 42.9 41.0  MCV 90.5 89.9  PLT 243 228   Basic Metabolic Panel Recent Labs    11/08/18 2231 11/06/18 0305  NA 137 140  K 4.0 3.9  CL 103 107  CO2 23 24  GLUCOSE 110* 88  BUN 19 13  CREATININE 0.95 0.85  CALCIUM 9.1 9.0    Cardiac Enzymes High Sensitivity Troponin:   Recent Labs  Lab 11/04/18 2231 11/05/18 0012 11/05/18 0348 11/05/18 0554 11/05/18 0902  TROPONINIHS 131* 345* 712* 884* 1,203*     Hemoglobin A1C Lab Results  Component Value Date   HGBA1C 5.2 11/05/2018    Fasting Lipid Panel  Recent Labs    11/05/18 0902  CHOL 157  HDL 46  LDLCALC 99  TRIG 62  CHOLHDL 3.4   _____________  Dg Chest 2 View  Result Date: 11/04/2018 CLINICAL DATA:  Chest pain onset today. EXAM: CHEST - 2 VIEW COMPARISON:  Radiograph 08/22/2015 FINDINGS: The cardiomediastinal contours are normal. The lungs are clear. Pulmonary vasculature is normal. No consolidation, pleural effusion, or pneumothorax. No acute osseous abnormalities are seen. IMPRESSION: No acute chest findings. Electronically Signed   By: 10/23/2015 M.D.   On: 11/04/2018 22:42   Disposition   Pt is being discharged home today in good condition.  Follow-up Plans & Appointments    Follow-up Information    11/06/2018, PA-C Follow up on 11/28/2018.   Specialties: Physician Assistant, Cardiology Why: Keep appointment Contact information: [address] [place] [phone]          Discharge Instructions    Diet - low sodium heart healthy   Complete by: As directed    Increase activity slowly   Complete by: As directed       Discharge Medications   Allergies as of 11/06/2018      Reactions   No Known Allergies       Medication List    TAKE these medications   aspirin EC 81 MG tablet Take 1 tablet (81 mg total) by mouth daily. Ok to take extra doses prn, not to exceed 325 mg What changed:   how much to take  when to take this  reasons to take this   additional instructions   atorvastatin 80 MG tablet Commonly known as: LIPITOR Take 1 tablet (80 mg total) by mouth daily at 6 PM.   multivitamin tablet Take 1 tablet by mouth daily. Patient takes 3-4 times a week "Daily advantage"   nitroGLYCERIN 0.4 MG SL tablet Commonly known as: NITROSTAT Place 1 tablet (0.4 mg total) under the tongue every 5 (five) minutes x 3 doses as needed for chest pain.         Outstanding Labs/Studies   None  Duration of Discharge Encounter   Greater than 30 minutes including physician time.  11/08/2018 Sha Burling NP 11/06/2018, 10:47 AM

## 2018-11-28 ENCOUNTER — Ambulatory Visit: Payer: BC Managed Care – PPO | Admitting: Student

## 2018-12-10 NOTE — Progress Notes (Signed)
Cardiology Office Note    Date:  12/11/2018   [name], DOB [birth year], MRN [id number]  PCP:  Patient, No Pcp Per  Cardiologist: Nona Dell, MD    Chief Complaint  Patient presents with  . Hospitalization Follow-up    History of Present Illness:    Craig [name] is a 58 y.o. male with past medical history of migraine headaches, former tobacco use, and family history of CAD who presents to the office today for hospital follow-up.  He most recently presented to [facility] ED on 11/05/2018 for evaluation of chest pain. 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  EKG showed no acute ischemic changes but high-sensitivity troponin values were found to be elevated to 712. Options were reviewed with the patient and he was transferred to [facility] for a cardiac catheterization for definitive evaluation. This was performed by Dr. Herbie Baltimore and showed angiographically normal coronary arteries. An echocardiogram was also obtained and showed a preserved EF of 60 to 65% with no regional wall motion abnormalities and no evidence of a pericardial effusion. It was thought that his symptoms might be secondary to vasospasm or myocarditis. He did report intermittent palpitations and it was recommended to consider an event monitor follow-up if he had recurrent symptoms (maintained normal sinus rhythm during admission).  He was started on ASA along with statin therapy.  In talking with the patient today, he denies any recurrent chest pain since hospital discharge. He has returned to his normal activities and denies any pain or dyspnea on exertion. No recent orthopnea, PND or lower extremity edema. No recurrent palpitations since hospital discharge either. He does consume over a pot of coffee a day but denies any alcohol use.  He developed a significant rash along his upper and lower extremities following hospital  discharge and upon reviewing possible causes he the patient and his wife thought this was secondary to Atorvastatin. He has since discontinued the medication and started a short course of steroids with improvement in his symptoms.  Past Medical History:  Diagnosis Date  . Arthritis   . Headache    occasional migraines treated with excedrin "a long time ago"  . History of cardiac cath    a. cath in 10/2018 showing normal cors with preserved EF.   Marland Kitchen PONV (postoperative nausea and vomiting)    after knee surgeries    Past Surgical History:  Procedure Laterality Date  . CARDIAC CATHETERIZATION  11/05/2018  . KNEE SURGERY     "bone spurr or cartilage operations", x6  . LEFT HEART CATH AND CORONARY ANGIOGRAPHY N/A 11/05/2018   Procedure: LEFT HEART CATH AND CORONARY ANGIOGRAPHY;  Surgeon: Marykay Lex, MD;  Location: [facility] INVASIVE CV LAB;  Service: Cardiovascular;  Laterality: N/A;  . LUMBAR LAMINECTOMY/DECOMPRESSION MICRODISCECTOMY N/A 08/24/2015   Procedure: L3-4 CENTRAL DECOMPRESSION ;  Surgeon: Eldred Manges, MD;  Location: [facility] OR;  Service: Orthopedics;  Laterality: N/A;    Current Medications: Outpatient Medications Prior to Visit  Medication Sig Dispense Refill  . aspirin EC 81 MG tablet Take 1 tablet (81 mg total) by mouth daily. Ok to take extra doses prn, not to exceed 325 mg    . Multiple Vitamin (MULTIVITAMIN) tablet Take 1 tablet by mouth daily. Patient takes 3-4 times a week "Daily advantage"    . nitroGLYCERIN (NITROSTAT) 0.4 MG SL tablet Place 1 tablet (0.4 mg total) under the tongue every  5 (five) minutes x 3 doses as needed for chest pain. 25 tablet 3  . predniSONE (STERAPRED UNI-PAK 48 TAB) 5 MG (48) TBPK tablet FPD    . atorvastatin (LIPITOR) 80 MG tablet Take 1 tablet (80 mg total) by mouth daily at 6 PM. 30 tablet 6   No facility-administered medications prior to visit.      Allergies:   No known allergies and Atorvastatin   Social History   Socioeconomic  History  . Marital status: Married    Spouse name: Not on file  . Number of children: Not on file  . Years of education: Not on file  . Highest education level: Not on file  Occupational History  . Not on file  Social Needs  . Financial resource strain: Not on file  . Food insecurity    Worry: Not on file    Inability: Not on file  . Transportation needs    Medical: Not on file    Non-medical: Not on file  Tobacco Use  . Smoking status: Former Smoker    Types: Cigars  . Smokeless tobacco: Never Used  . Tobacco comment: daily cigar 2x/day  Substance and Sexual Activity  . Alcohol use: No  . Drug use: No  . Sexual activity: Not on file  Lifestyle  . Physical activity    Days per week: Not on file    Minutes per session: Not on file  . Stress: Not on file  Relationships  . Social Musician on phone: Not on file    Gets together: Not on file    Attends religious service: Not on file    Active member of club or organization: Not on file    Attends meetings of clubs or organizations: Not on file    Relationship status: Not on file  Other Topics Concern  . Not on file  Social History Narrative  . Not on file     Family History:  The patient's family history includes Atrial fibrillation in his mother; Coronary artery disease in his father.   Review of Systems:   Please see the history of present illness.     General:  No chills, fever, night sweats or weight changes.  Cardiovascular:  No dyspnea on exertion, edema, orthopnea,  paroxysmal nocturnal dyspnea. Positive for chest pain and palpitations (both now resolved).  Dermatological: No rash, lesions/masses Respiratory: No cough, dyspnea Urologic: No hematuria, dysuria Abdominal:   No nausea, vomiting, diarrhea, bright red blood per rectum, melena, or hematemesis Neurologic:  No visual changes, wkns, changes in mental status. All other systems reviewed and are otherwise negative except as noted above.    Physical Exam:    VS:  BP 132/72   Pulse 78   Temp 97.7 F (36.5 C)   Ht 6\' 5"  (1.956 m)   Wt 211 lb (95.7 kg)   SpO2 98%   BMI 25.02 kg/m    General: Well developed, well nourished,male appearing in no acute distress. Head: Normocephalic, atraumatic, sclera non-icteric, no xanthomas, nares are without discharge.  Neck: No carotid bruits. JVD not elevated.  Lungs: Respirations regular and unlabored, without wheezes or rales.  Heart: Regular rate and rhythm. No S3 or S4.  No murmur, no rubs, or gallops appreciated. Abdomen: Soft, non-tender, non-distended with normoactive bowel sounds. No hepatomegaly. No rebound/guarding. No obvious abdominal masses. Msk:  Strength and tone appear normal for age. No joint deformities or effusions. Extremities: No clubbing or cyanosis. No lower  extremity edema.  Distal pedal pulses are 2+ bilaterally. Radial site stable without ecchymosis or evidence of a hematoma.  Neuro: Alert and oriented X 3. Moves all extremities spontaneously. No focal deficits noted. Psych:  Responds to questions appropriately with a normal affect. Skin: No rashes or lesions noted  Wt Readings from Last 3 Encounters:  12/11/18 211 lb (95.7 kg)  11/06/18 196 lb 3.4 oz (89 kg)  08/24/15 209 lb (94.8 kg)     Studies/Labs Reviewed:   EKG:  EKG is not ordered today.   Recent Labs: 11/06/2018: BUN 13; Creatinine, Ser 0.85; Hemoglobin 13.4; Platelets 228; Potassium 3.9; Sodium 140   Lipid Panel    Component Value Date/Time   CHOL 157 11/05/2018 0902   TRIG 62 11/05/2018 0902   HDL 46 11/05/2018 0902   CHOLHDL 3.4 11/05/2018 0902   VLDL 12 11/05/2018 0902   LDLCALC 99 11/05/2018 0902    Additional studies/ records that were reviewed today include:   Cardiac Catheterization: 11/05/2018  The left ventricular systolic function is normal.  LV end diastolic pressure is normal.  The left ventricular ejection fraction is 55-65% by visual estimate.  There is no aortic  valve stenosis.  There is no mitral valve regurgitation.   SUMMARY  Angiographically normal coronary arteries  Normal left ventricular function with normal filling pressures.   RECOMMENDATIONS  Would look for non-coronary artery disease related reason for troponin elevation, consider pericarditis.  Otherwise would be stable for discharge from cardiac standpoint either the second tomorrow morning.  Echocardiogram: 11/05/2018 IMPRESSIONS    1. Left ventricular ejection fraction, by visual estimation, is 60 to 65%. The left ventricle has normal function. Normal left ventricular size. There is no left ventricular hypertrophy.  2. Left ventricular diastolic Doppler parameters are indeterminate pattern of LV diastolic filling.  3. Global right ventricle has normal systolic function.The right ventricular size is normal. No increase in right ventricular wall thickness.  4. Left atrial size was normal.  5. Right atrial size was normal.  6. The mitral valve is normal in structure. Trace mitral valve regurgitation. No evidence of mitral stenosis.  7. The tricuspid valve is normal in structure. Tricuspid valve regurgitation was not visualized by color flow Doppler.  8. The aortic valve is normal in structure. Aortic valve regurgitation was not visualized by color flow Doppler. Mild aortic valve sclerosis without stenosis.  9. The pulmonic valve was normal in structure. Pulmonic valve regurgitation is not visualized by color flow Doppler. 10. The inferior vena cava is dilated in size with <50% respiratory variability, suggesting right atrial pressure of 15 mmHg.   Assessment:    1. Exertional chest pain   2. Elevated troponin level not due to acute coronary syndrome   3. Palpitations      Plan:   In order of problems listed above:  1. Exertional Chest Pain/Elevated Troponin Values - He recently presented with exertional chest discomfort and troponin values were found to be  elevated to 712. Cardiac catheterization showed angiographically normal coronary arteries and echocardiogram showed a preserved EF with no evidence of a pericardial effusion.  It was thought that his symptoms might have been secondary to vasospasm or myocarditis. - He denies any recurrent symptoms since hospital discharge and has returned to his normal activities. - He developed a significant rash with statin therapy and LDL was 99 during admission. Given no evidence of CAD, I recommended continued dietary and exercise management for cholesterol control. Will continue ASA and he does  have SL NTG if needed.   2. Palpitations - He was having symptoms prior to admission but denies any recurrent episodes since. I did recommend that he reduce his caffeine intake to see if this helps if palpitations occur in the future. We also reviewed the possibility of an event monitor or AliveCor monitor in the future if recurrent symptoms but he wishes to hold off on these for now which certainly seems reasonable.    Will follow-up with Cardiology on an as-needed basis. Our contact information was provided.   Medication Adjustments/Labs and Tests Ordered: Current medicines are reviewed at length with the patient today.  Concerns regarding medicines are outlined above.  Medication changes, Labs and Tests ordered today are listed in the Patient Instructions below. Patient Instructions  Medication Instructions:  Your physician recommends that you continue on your current medications as directed. Please refer to the Current Medication list given to you today.  *If you need a refill on your cardiac medications before your next appointment, please call your pharmacy*  Lab Work: NONE  If you have labs (blood work) drawn today and your tests are completely normal, you will receive your results only by: Marland Kitchen. MyChart Message (if you have MyChart) OR . A paper copy in the mail If you have any lab test that is abnormal or we  need to change your treatment, we will call you to review the results.  Testing/Procedures: NONE   Follow-Up: At [facility], you and your health needs are our priority.  As part of our continuing mission to provide you with exceptional heart care, we have created designated Provider Care Teams.  These Care Teams include your primary Cardiologist (physician) and Advanced Practice Providers (APPs -  Physician Assistants and Nurse Practitioners) who all work together to provide you with the care you need, when you need it.  Your next appointment:   As Needed   The format for your next appointment:   In Person  Provider:   Nona DellSamuel McDowell, MD  Other Instructions Thank you for choosing [facility]!       Signed, Ellsworth LennoxBrittany M Marcee Jacobs, PA-C  12/11/2018 5:19 PM    [facility] [address] [place], [place] [address] Phone: [phone] Fax: [phone]

## 2018-12-11 ENCOUNTER — Encounter: Payer: Self-pay | Admitting: Student

## 2018-12-11 ENCOUNTER — Other Ambulatory Visit: Payer: Self-pay

## 2018-12-11 ENCOUNTER — Ambulatory Visit: Payer: BC Managed Care – PPO | Admitting: Student

## 2018-12-11 VITALS — BP 132/72 | HR 78 | Temp 97.7°F | Ht 77.0 in | Wt 211.0 lb

## 2018-12-11 DIAGNOSIS — R778 Other specified abnormalities of plasma proteins: Secondary | ICD-10-CM

## 2018-12-11 DIAGNOSIS — R002 Palpitations: Secondary | ICD-10-CM | POA: Diagnosis not present

## 2018-12-11 DIAGNOSIS — R079 Chest pain, unspecified: Secondary | ICD-10-CM

## 2018-12-11 NOTE — Patient Instructions (Signed)
Medication Instructions:  Your physician recommends that you continue on your current medications as directed. Please refer to the Current Medication list given to you today.  *If you need a refill on your cardiac medications before your next appointment, please call your pharmacy*  Lab Work: NONE  If you have labs (blood work) drawn today and your tests are completely normal, you will receive your results only by: Marland Kitchen MyChart Message (if you have MyChart) OR . A paper copy in the mail If you have any lab test that is abnormal or we need to change your treatment, we will call you to review the results.  Testing/Procedures: NONE   Follow-Up: At [facility], you and your health needs are our priority.  As part of our continuing mission to provide you with exceptional heart care, we have created designated Provider Care Teams.  These Care Teams include your primary Cardiologist (physician) and Advanced Practice Providers (APPs -  Physician Assistants and Nurse Practitioners) who all work together to provide you with the care you need, when you need it.  Your next appointment:   As Needed   The format for your next appointment:   In Person  Provider:   Rozann Lesches, MD  Other Instructions Thank you for choosing [facility]!

## 2019-01-13 ENCOUNTER — Telehealth: Payer: Self-pay | Admitting: Radiology

## 2019-01-13 NOTE — Telephone Encounter (Signed)
Dr. Lorin Mercy received note from Dr. Bethann Goo in regards to patient's low back pain and symptoms. Per Dr. Lorin Mercy, see if patient would like to be seen in the [facility] office if this works better for him. I offered appt in either [place] or [place].  Asked that patient return call if he would like to schedule.

## 2019-01-20 ENCOUNTER — Other Ambulatory Visit: Payer: Self-pay

## 2019-01-20 ENCOUNTER — Encounter: Payer: Self-pay | Admitting: Orthopaedic Surgery

## 2019-01-20 ENCOUNTER — Ambulatory Visit: Payer: BC Managed Care – PPO | Admitting: Orthopaedic Surgery

## 2019-01-20 ENCOUNTER — Ambulatory Visit: Payer: Self-pay

## 2019-01-20 VITALS — Ht 77.0 in | Wt 200.0 lb

## 2019-01-20 DIAGNOSIS — M4316 Spondylolisthesis, lumbar region: Secondary | ICD-10-CM

## 2019-01-20 DIAGNOSIS — M5136 Other intervertebral disc degeneration, lumbar region: Secondary | ICD-10-CM

## 2019-01-20 DIAGNOSIS — M545 Low back pain, unspecified: Secondary | ICD-10-CM

## 2019-01-20 DIAGNOSIS — M4726 Other spondylosis with radiculopathy, lumbar region: Secondary | ICD-10-CM | POA: Diagnosis not present

## 2019-01-20 NOTE — Progress Notes (Signed)
Office Visit Note   Patient: Craig [name]           Date of Birth: May 21, 1960           MRN: [id number] Visit Date: 01/20/2019              Requested by: No referring provider defined for this encounter. PCP: Patient, No Pcp Per   Assessment & Plan: Visit Diagnoses:  1. Acute left-sided low back pain, unspecified whether sciatica present   2. Other spondylosis with radiculopathy, lumbar region     Plan: With patient's ongoing and worsening symptoms that have failed conservative treatment with prednisone taper, muscle relaxer, chiropractic treatments, activity modification I recommend getting lumbar MRI with and without contrast to rule out HNP/stenosis.  Patient will follow up with Dr. Lorin Mercy after completion of the study to discuss results and further treatment options.  Follow-Up Instructions: Return in about 2 weeks (around 02/03/2019) for With Dr. Lorin Mercy to review lumbar MRI.   Orders:  Orders Placed This Encounter  Procedures  . XR Lumbar Spine 2-3 Views   No orders of the defined types were placed in this encounter.     Procedures: No procedures performed   Clinical Data: No additional findings.   Subjective: Chief Complaint  Patient presents with  . Lower Back - Pain    HPI 58 year old white male comes in with complaints of ongoing and worsening low back pain and left lower extremity radiculopathy.  Patient is status post L3-4 central decompression by Dr. Lorin Mercy August 24, 2015.  He states that he had been doing reasonably well up until a couple months ago when he was walking his dog any twisted and felt sharp pain in the low back.  Pain progressively worsened on the left side with radiation into the left buttock and left thigh above the knee.  He has had severe spasms of the thigh when the pain gets bad.  Walking distances have decreased due to his increase left leg symptoms.  He was seen at an urgent care and prescribed 6-day prednisone taper along with Flexeril  and states that this did not give any long-term improvement.  Patient states that over the last 6 weeks he has had multiple chiropractic treatments without any improvement.  Symptoms are constant but worse with activity.  No complaints of bowel or bladder incontinence. Review of Systems No current cardiac pulmonary GI GU issues  Objective: Vital Signs: Ht 6\' 5"  (1.956 m)   Wt 200 lb (90.7 kg)   BMI 23.72 kg/m   Physical Exam Constitutional:      Appearance: Normal appearance.  HENT:     Head: Normocephalic and atraumatic.  Eyes:     Extraocular Movements: Extraocular movements intact.     Pupils: Pupils are equal, round, and reactive to light.  Pulmonary:     Effort: No respiratory distress.  Musculoskeletal:     Comments: Gait is antalgic.  Decreased lumbar flexion extension due to discomfort.  Positive left-sided notch tenderness.  Negative on the right side.  Negative logroll bilateral hips.  Positive left straight leg raise.  Negative on the right side.  Neurovascular intact.  No focal motor deficits.  Neurological:     Mental Status: He is alert.  Psychiatric:        Mood and Affect: Mood normal.        Behavior: Behavior normal.     Ortho Exam  Specialty Comments:  No specialty comments available.  Imaging: No results  found.   PMFS History: Patient Active Problem List   Diagnosis Date Noted  . Elevated troponin level not due to acute coronary syndrome 11/05/2018  . Lumbar stenosis 08/24/2015   Past Medical History:  Diagnosis Date  . Arthritis   . Headache    occasional migraines treated with excedrin "a long time ago"  . History of cardiac cath    a. cath in 10/2018 showing normal cors with preserved EF.   Marland Kitchen PONV (postoperative nausea and vomiting)    after knee surgeries    Family History  Problem Relation Age of Onset  . Atrial fibrillation Mother   . Coronary artery disease Father        CABG in his 44's    Past Surgical History:  Procedure  Laterality Date  . CARDIAC CATHETERIZATION  11/05/2018  . KNEE SURGERY     "bone spurr or cartilage operations", x6  . LEFT HEART CATH AND CORONARY ANGIOGRAPHY N/A 11/05/2018   Procedure: LEFT HEART CATH AND CORONARY ANGIOGRAPHY;  Surgeon: Marykay Lex, MD;  Location: [facility] INVASIVE CV LAB;  Service: Cardiovascular;  Laterality: N/A;  . LUMBAR LAMINECTOMY/DECOMPRESSION MICRODISCECTOMY N/A 08/24/2015   Procedure: L3-4 CENTRAL DECOMPRESSION ;  Surgeon: Eldred Manges, MD;  Location: [facility] OR;  Service: Orthopedics;  Laterality: N/A;   Social History   Occupational History  . Not on file  Tobacco Use  . Smoking status: Former Smoker    Types: Cigars  . Smokeless tobacco: Never Used  . Tobacco comment: daily cigar 2x/day  Substance and Sexual Activity  . Alcohol use: No  . Drug use: No  . Sexual activity: Not on file

## 2019-01-23 ENCOUNTER — Ambulatory Visit (HOSPITAL_COMMUNITY)
Admission: RE | Admit: 2019-01-23 | Discharge: 2019-01-23 | Disposition: A | Discharge status: Home / Self Care | Payer: BC Managed Care – PPO | Source: Ambulatory Visit | Attending: Surgery | Admitting: Surgery

## 2019-01-23 ENCOUNTER — Other Ambulatory Visit: Payer: Self-pay

## 2019-01-23 DIAGNOSIS — M4316 Spondylolisthesis, lumbar region: Secondary | ICD-10-CM | POA: Diagnosis present

## 2019-01-23 DIAGNOSIS — M5136 Other intervertebral disc degeneration, lumbar region: Secondary | ICD-10-CM | POA: Insufficient documentation

## 2019-01-23 MED ORDER — GADOBUTROL 1 MMOL/ML IV SOLN
9.0000 mL | Freq: Once | INTRAVENOUS | Status: AC | PRN
  Administered 2019-01-23: 9 mL via INTRAVENOUS

## 2019-02-10 ENCOUNTER — Ambulatory Visit: Payer: BC Managed Care – PPO | Admitting: Orthopaedic Surgery

## 2019-02-10 ENCOUNTER — Other Ambulatory Visit: Payer: Self-pay

## 2019-02-10 ENCOUNTER — Encounter: Payer: Self-pay | Admitting: Orthopaedic Surgery

## 2019-02-10 VITALS — Ht 77.0 in | Wt 200.0 lb

## 2019-02-10 DIAGNOSIS — M48061 Spinal stenosis, lumbar region without neurogenic claudication: Secondary | ICD-10-CM | POA: Diagnosis not present

## 2019-02-10 NOTE — Progress Notes (Signed)
Office Visit Note   Patient: Craig [name]           Date of Birth: [birth year]           MRN: [id number] Visit Date: 02/10/2019              Requested by: No referring provider defined for this encounter. PCP: Patient, No Pcp Per   Assessment & Plan: Visit Diagnoses:  1. Spinal stenosis of lumbar region without neurogenic claudication       Post lumbar decompression 3-4.  Mild lateral recess narrowing L4-5.  Plan: Patient is doing better and is moving better.  He has modified his activities to avoid repetitive turning and twisting.  He has some moderate by foraminal stenosis at L4-5.  Satisfactory decompression where he had spinal stenosis at L3-4.  Currently he is doing better and he can return if he has increased symptoms.  Follow-Up Instructions: No follow-ups on file.   Orders:  No orders of the defined types were placed in this encounter.  No orders of the defined types were placed in this encounter.     Procedures: No procedures performed   Clinical Data: No additional findings.   Subjective: Chief Complaint  Patient presents with  . Lower Back - Pain, Follow-up    MRI Lumbar Review     HPI 58 year old male returns post MRI scan lumbar spine.  Patient states his symptoms doing about the same with slight improvement.  He states when he is outside turning or twisting he has some increased symptoms.  He has been treated with prednisone taper, chiropractic treatment, muscle relaxants, anti-inflammatories.  No bowel or bladder symptoms.  Review of Systems 14 point update unchanged from 01/20/2019 office visit other than the mentioned in HPI. Hospitalized September 2020 with acute coronary syndrome.  Cardiac cath showed ejection fraction 55 to 65% without stenosis or regurgitation.  He had normal coronary arteries.  Objective: Vital Signs: Ht 6\' 5"  (1.956 m)   Wt 200 lb (90.7 kg)   BMI 23.72 kg/m   Physical Exam Constitutional:      Appearance: He is  well-developed.  HENT:     Head: Normocephalic and atraumatic.  Eyes:     Pupils: Pupils are equal, round, and reactive to light.  Neck:     Thyroid: No thyromegaly.     Trachea: No tracheal deviation.  Cardiovascular:     Rate and Rhythm: Normal rate.  Pulmonary:     Effort: Pulmonary effort is normal.     Breath sounds: No wheezing.  Abdominal:     General: Bowel sounds are normal.     Palpations: Abdomen is soft.  Skin:    General: Skin is warm and dry.     Capillary Refill: Capillary refill takes less than 2 seconds.  Neurological:     Mental Status: He is alert and oriented to person, place, and time.  Psychiatric:        Behavior: Behavior normal.        Thought Content: Thought content normal.        Judgment: Judgment normal.     Ortho Exam well-healed lumbar incision.  He is able to heel and toe walk.  Discomfort with bending turning and twisting.  Specialty Comments:  No specialty comments available.  Imaging: Study Result  CLINICAL DATA:  Chronic progressive low back pain and left lower extremity pain.  EXAM: MRI LUMBAR SPINE WITHOUT AND WITH CONTRAST  TECHNIQUE: Multiplanar and multiecho pulse sequences  of the lumbar spine were obtained without and with intravenous contrast.  CONTRAST:  44mL GADAVIST GADOBUTROL 1 MMOL/ML IV SOLN  COMPARISON:  Radiographs dated 01/20/2019 and lumbar MRI dated 08/04/2015  FINDINGS: Segmentation:  Transitional L5 vertebra.  Alignment: 2 mm anterolisthesis of L3 on L4. 2 mm retrolisthesis of L4 on L5.  Vertebrae:  No fracture, evidence of discitis, or bone lesion.  Conus medullaris and cauda equina: Conus extends to the T12 level. Conus and cauda equina appear normal.  Paraspinal and other soft tissues: Negative.  Disc levels:  L1-2: Tiny disc bulge to the right of midline with no neural impingement, new since the prior study.  L2-3: Disc desiccation with disc space narrowing. Small  broad-based disc protrusion with accompanying osteophytes slightly increased since the prior study. Symmetrical compression of the thecal sac without focal neural impingement. Slight spinal stenosis.  L3-4: Disc desiccation with disc space narrowing. Small broad-based soft disc protrusion asymmetric to the left extending into the left neural foramen. Interval posterior decompression with relief of the spinal stenosis. Hypertrophy of the left ligamentum flavum and facet joint does encroach upon the left lateral recess best seen on images 23 and 24 of series 7 which could affect the left L4 nerve. The protrusion extends into the left neural foramen and is immediately adjacent to the exiting left L3 nerve but the nerve appears to exit without impingement. Moderate bilateral facet arthritis. There is enhancement around the left facet joint at L3-4 after contrast administration. Minimal enhancing scarring at the operative level to the expected degree.  L4-5: Slight retrolisthesis, new since the prior study with a broad-based disc bulge with a small central subligamentous disc protrusion with a small extrusion extending inferiorly to the left of midline behind the body of L5 seen on image 13 of series 4. This does not appear to compress the nerve roots. Slight bilateral facet arthritis with ligamentum flavum hypertrophy. Moderate bilateral foraminal stenosis, unchanged.  L5-S1: Disc space narrowing. Tiny disc bulge to the right of midline with an annular fissure. No neural impingement.  IMPRESSION: 1. Interval posterior decompression at L3-4 with relief of the spinal stenosis. 2. Left lateral recess and left foraminal impingement at L3-4 which could affect the left L4 nerve. 3. Moderate bilateral foraminal stenosis at L4-5, unchanged.   Electronically Signed   By: Francene Boyers M.D.   On: 01/23/2019 18:13       PMFS History: Patient Active Problem List   Diagnosis  Date Noted  . Elevated troponin level not due to acute coronary syndrome 11/05/2018  . Lumbar stenosis 08/24/2015   Past Medical History:  Diagnosis Date  . Arthritis   . Headache    occasional migraines treated with excedrin "a long time ago"  . History of cardiac cath    a. cath in 10/2018 showing normal cors with preserved EF.   Marland Kitchen PONV (postoperative nausea and vomiting)    after knee surgeries    Family History  Problem Relation Age of Onset  . Atrial fibrillation Mother   . Coronary artery disease Father        CABG in his 5's    Past Surgical History:  Procedure Laterality Date  . CARDIAC CATHETERIZATION  11/05/2018  . KNEE SURGERY     "bone spurr or cartilage operations", x6  . LEFT HEART CATH AND CORONARY ANGIOGRAPHY N/A 11/05/2018   Procedure: LEFT HEART CATH AND CORONARY ANGIOGRAPHY;  Surgeon: Marykay Lex, MD;  Location: [facility] INVASIVE CV LAB;  Service:  Cardiovascular;  Laterality: N/A;  . LUMBAR LAMINECTOMY/DECOMPRESSION MICRODISCECTOMY N/A 08/24/2015   Procedure: L3-4 CENTRAL DECOMPRESSION ;  Surgeon: Eldred MangesMark C Anitta Tenny, MD;  Location: [facility] OR;  Service: Orthopedics;  Laterality: N/A;   Social History   Occupational History  . Not on file  Tobacco Use  . Smoking status: Former Smoker    Types: Cigars  . Smokeless tobacco: Never Used  . Tobacco comment: daily cigar 2x/day  Substance and Sexual Activity  . Alcohol use: No  . Drug use: No  . Sexual activity: Not on file

## 2020-05-10 ENCOUNTER — Encounter: Payer: Self-pay | Admitting: Urology

## 2020-05-10 ENCOUNTER — Ambulatory Visit (INDEPENDENT_AMBULATORY_CARE_PROVIDER_SITE_OTHER): Payer: BC Managed Care – PPO | Admitting: Urology

## 2020-05-10 ENCOUNTER — Other Ambulatory Visit: Payer: Self-pay

## 2020-05-10 VITALS — BP 124/73 | HR 79 | Temp 98.4°F | Ht 77.0 in | Wt 190.0 lb

## 2020-05-10 DIAGNOSIS — N433 Hydrocele, unspecified: Secondary | ICD-10-CM

## 2020-05-10 DIAGNOSIS — N5089 Other specified disorders of the male genital organs: Secondary | ICD-10-CM

## 2020-05-10 DIAGNOSIS — N5201 Erectile dysfunction due to arterial insufficiency: Secondary | ICD-10-CM

## 2020-05-10 LAB — URINALYSIS, ROUTINE W REFLEX MICROSCOPIC
Bilirubin, UA: NEGATIVE
Glucose, UA: NEGATIVE
Ketones, UA: NEGATIVE
Leukocytes,UA: NEGATIVE
Nitrite, UA: NEGATIVE
Protein,UA: NEGATIVE
RBC, UA: NEGATIVE
Specific Gravity, UA: 1.015 (ref 1.005–1.030)
Urobilinogen, Ur: 0.2 mg/dL (ref 0.2–1.0)
pH, UA: 7 (ref 5.0–7.5)

## 2020-05-10 NOTE — Progress Notes (Signed)
H&P  Chief Complaint: Swollen right testicle  History of Present Illness: 60 year old male, native of [place], self-referred for evaluation and management of right scrotal swelling.  This started about 2 months ago.  It is nonpainful.  His wife who is a Publishing rights manager felt he should have a urologic evaluation of this.  He denies any trauma to this area, denies any left-sided swelling.  He also has erectile dysfunction.  He would like to be on medications for this.  He does not have a primary care physician.  Past Medical History:  Diagnosis Date  . Arthritis   . Headache    occasional migraines treated with excedrin "a long time ago"  . History of cardiac cath    a. cath in 10/2018 showing normal cors with preserved EF.   Marland Kitchen PONV (postoperative nausea and vomiting)    after knee surgeries    Past Surgical History:  Procedure Laterality Date  . CARDIAC CATHETERIZATION  11/05/2018  . KNEE SURGERY     "bone spurr or cartilage operations", x6  . LEFT HEART CATH AND CORONARY ANGIOGRAPHY N/A 11/05/2018   Procedure: LEFT HEART CATH AND CORONARY ANGIOGRAPHY;  Surgeon: Marykay Lex, MD;  Location: [facility] INVASIVE CV LAB;  Service: Cardiovascular;  Laterality: N/A;  . LUMBAR LAMINECTOMY/DECOMPRESSION MICRODISCECTOMY N/A 08/24/2015   Procedure: L3-4 CENTRAL DECOMPRESSION ;  Surgeon: Eldred Manges, MD;  Location: [facility] OR;  Service: Orthopedics;  Laterality: N/A;    Home Medications:  Allergies as of 05/10/2020      Reactions   No Known Allergies    Atorvastatin Rash      Medication List       Accurate as of May 10, 2020  3:19 PM. If you have any questions, ask your nurse or doctor.        aspirin EC 81 MG tablet Take 1 tablet (81 mg total) by mouth daily. Ok to take extra doses prn, not to exceed 325 mg   cyclobenzaprine 10 MG tablet Commonly known as: FLEXERIL Take 10 mg by mouth 3 (three) times daily as needed.   fenofibrate 145 MG tablet Commonly known as:  TRICOR fenofibrate nanocrystallized 145 mg tablet  TAKE 1 TABLET BY MOUTH EVERY DAY   hydroxychloroquine 200 MG tablet Commonly known as: PLAQUENIL hydroxychloroquine 200 mg tablet   multivitamin tablet Take 1 tablet by mouth daily. Patient takes 3-4 times a week "Daily advantage"   nitroGLYCERIN 0.4 MG SL tablet Commonly known as: NITROSTAT Place 1 tablet (0.4 mg total) under the tongue every 5 (five) minutes x 3 doses as needed for chest pain.   predniSONE 5 MG (48) Tbpk tablet Commonly known as: STERAPRED UNI-PAK 48 TAB FPD   promethazine 25 MG tablet Commonly known as: PHENERGAN promethazine 25 mg tablet  TAKE 1 TABLET BY MOUTH EVERY 4 TO 6 HOURS AS NEEDED FORNAUSEA AND VOMITING       Allergies:  Allergies  Allergen Reactions  . No Known Allergies   . Atorvastatin Rash    Family History  Problem Relation Age of Onset  . Atrial fibrillation Mother   . Coronary artery disease Father        CABG in his 47's    Social History:  reports that he has quit smoking. His smoking use included cigars. He has never used smokeless tobacco. He reports that he does not drink alcohol and does not use drugs.  ROS: A complete review of systems was performed.  All systems are negative except  for pertinent findings as noted.  Physical Exam:  Vital signs in last 24 hours: BP 124/73   Pulse 79   Temp 98.4 F (36.9 C)   Ht 6\' 5"  (1.956 m)   Wt 190 lb (86.2 kg)   BMI 22.53 kg/m  Constitutional:  Alert and oriented, No acute distress Cardiovascular: Regular rate  Respiratory: Normal respiratory effort GI: Abdomen is soft, nontender, nondistended, no abdominal masses. No CVAT.  Genitourinary: Phallus circumcised, normal.  Scrotal skin normal.  Left testicle normal.  Epididymis on left normal.  Right testicle and scrotal structures nonpalpable due to moderate size hydrocele.  No inguinal hernias.  Prostate 30 g, symmetric, nonnodular, nontender. Lymphatic: No  lymphadenopathy Neurologic: Grossly intact, no focal deficits Psychiatric: Normal mood and affect  I have reviewed prior pt notes  I have reviewed urinalysis results  Impression/Assessment:  1.  Right hydrocele, minimally symptomatic.  I cannot palpate his testicle, however  2.  ED, patient desires medical therapy  Plan:  1.  Prescription for Cialis given to patient  2.  I will arrange for scrotal ultrasound-we will call with results.  If benign appearing testicle, I will have him follow-up just every 2 years if he wants his Cialis refilled

## 2020-05-10 NOTE — Progress Notes (Signed)
Urological Symptom Review  Patient is experiencing the following symptoms: Get up at night to urinate Erection problems (male only)   Review of Systems  Gastrointestinal (upper)  : Negative for upper GI symptoms  Gastrointestinal (lower) : Negative for lower GI symptoms  Constitutional : Weight loss  Skin: Negative for skin symptoms  Eyes: Negative for eye symptoms  Ear/Nose/Throat : Negative for Ear/Nose/Throat symptoms  Hematologic/Lymphatic: Negative for Hematologic/Lymphatic symptoms  Cardiovascular : Negative for cardiovascular symptoms  Respiratory : Negative for respiratory symptoms  Endocrine: Negative for endocrine symptoms  Musculoskeletal: Back pain  Neurological: Negative for neurological symptoms  Psychologic: Negative for psychiatric symptoms

## 2020-05-23 ENCOUNTER — Ambulatory Visit (HOSPITAL_COMMUNITY)
Admission: RE | Admit: 2020-05-23 | Discharge: 2020-05-23 | Disposition: A | Discharge status: Home / Self Care | Payer: BC Managed Care – PPO | Source: Ambulatory Visit | Attending: Urology | Admitting: Urology

## 2020-05-23 DIAGNOSIS — N433 Hydrocele, unspecified: Secondary | ICD-10-CM | POA: Insufficient documentation

## 2020-05-25 ENCOUNTER — Telehealth: Payer: Self-pay

## 2020-05-25 NOTE — Telephone Encounter (Signed)
-----   Message from Marcine Matar, MD sent at 05/24/2020  7:35 PM EDT ----- Notify pt--testicle looks fine--he just has a hydrocele w/ nothing that needs to be treated ----- Message ----- From: Gustavus Messing, LPN Sent: 6/55/3748  10:58 AM EDT To: Marcine Matar, MD  Please review

## 2020-05-26 NOTE — Telephone Encounter (Signed)
-----   Message from Stephen Dahlstedt, MD sent at 05/24/2020  7:35 PM EDT ----- Notify pt--testicle looks fine--he just has a hydrocele w/ nothing that needs to be treated ----- Message ----- From: Cobb, Hope, LPN Sent: 05/24/2020  10:58 AM EDT To: Stephen Dahlstedt, MD  Please review  

## 2020-05-26 NOTE — Telephone Encounter (Signed)
Spoke with pts wife. Gave results.

## 2020-10-22 IMAGING — DX DG CHEST 2V
2 series · 3 of 3 positions shown · non-contrast
Comparison: Radiograph 08/22/2015

CLINICAL DATA: Chest pain onset today.

EXAM:
CHEST - 2 VIEW

[chest pa]
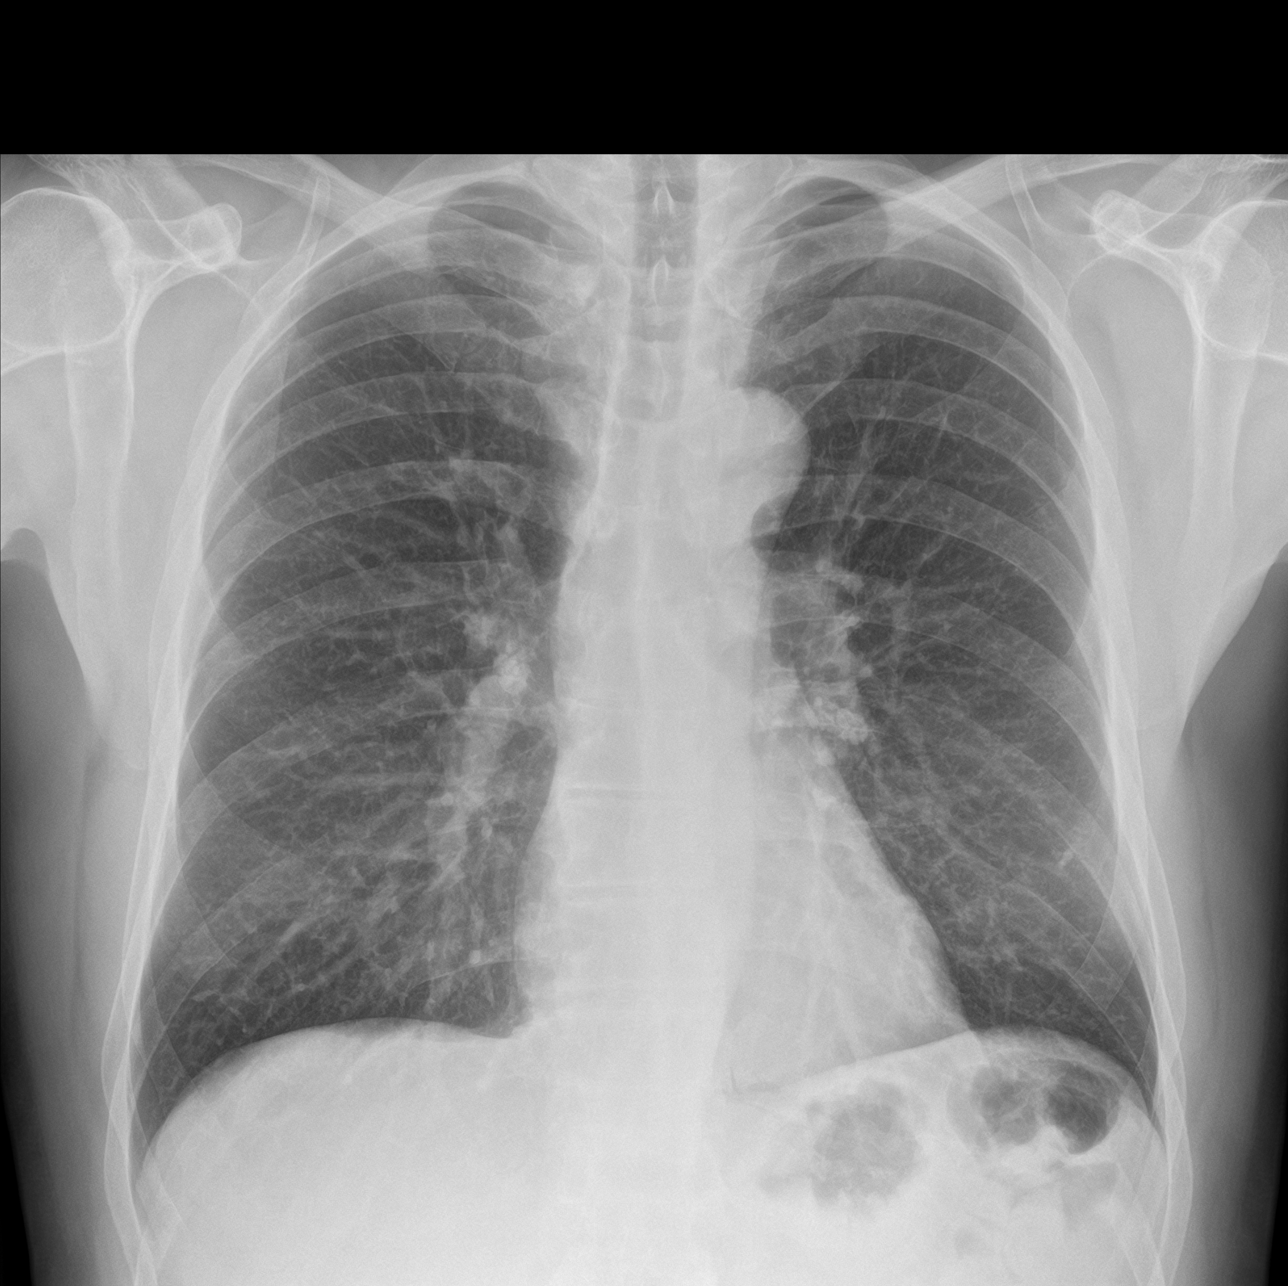

[Series 2: chest lat · 0.14mm/px · 2 of 2 slices shown]
[im 1/2]
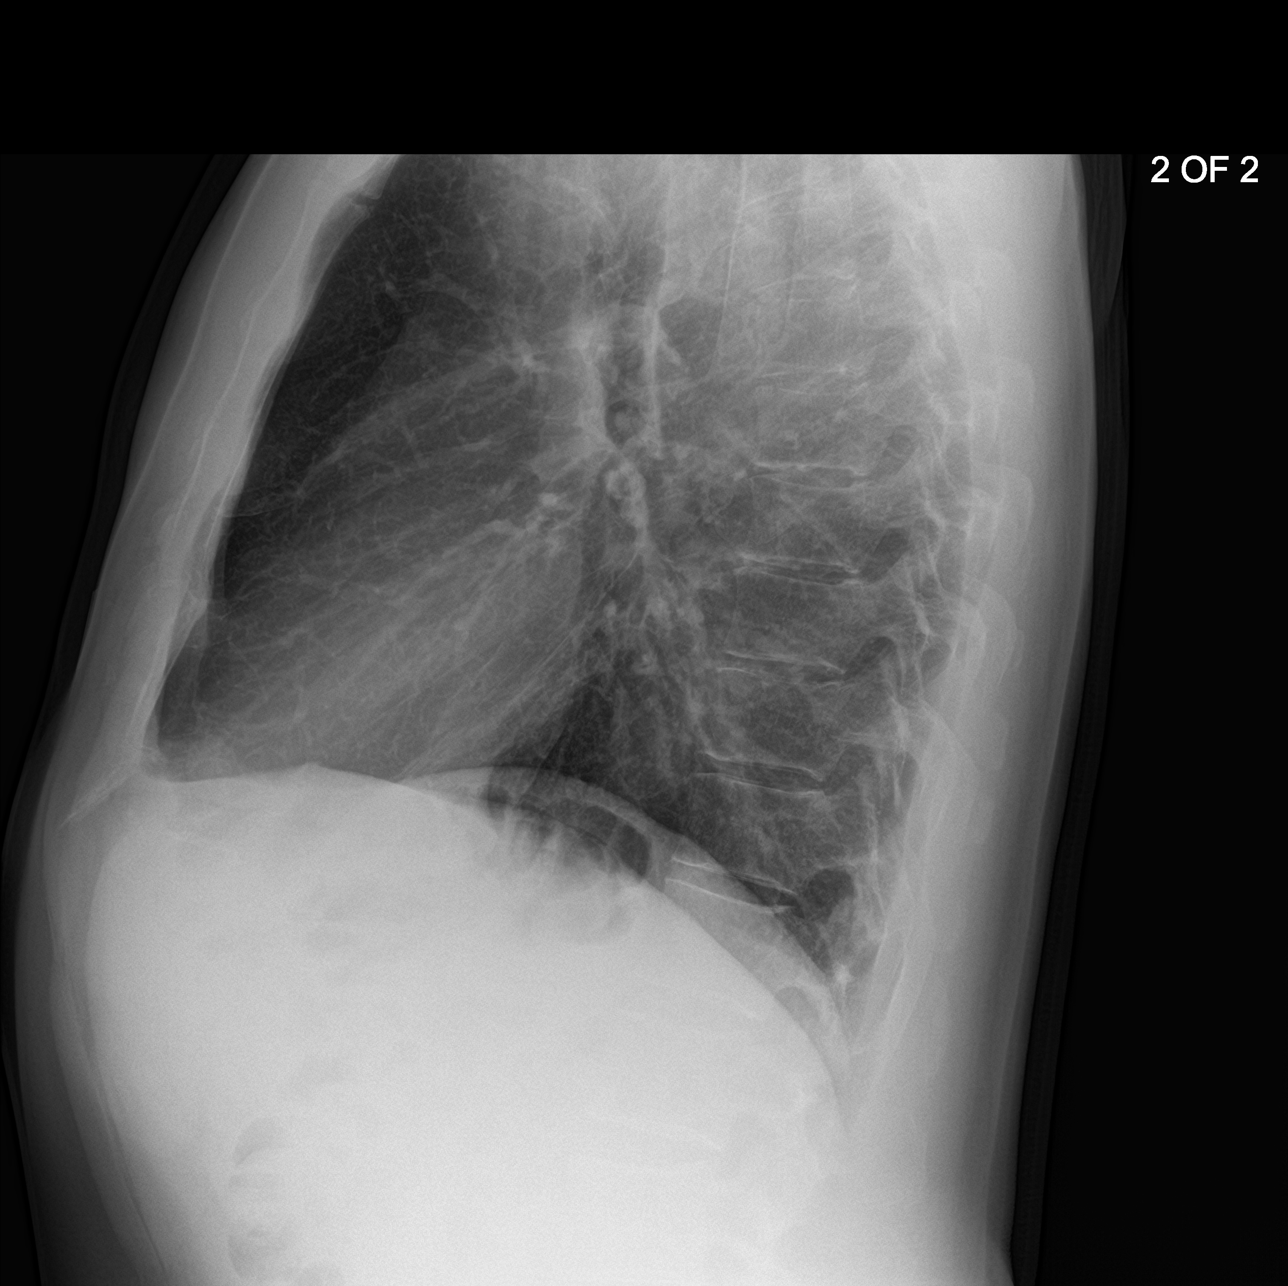
[im 2/2]
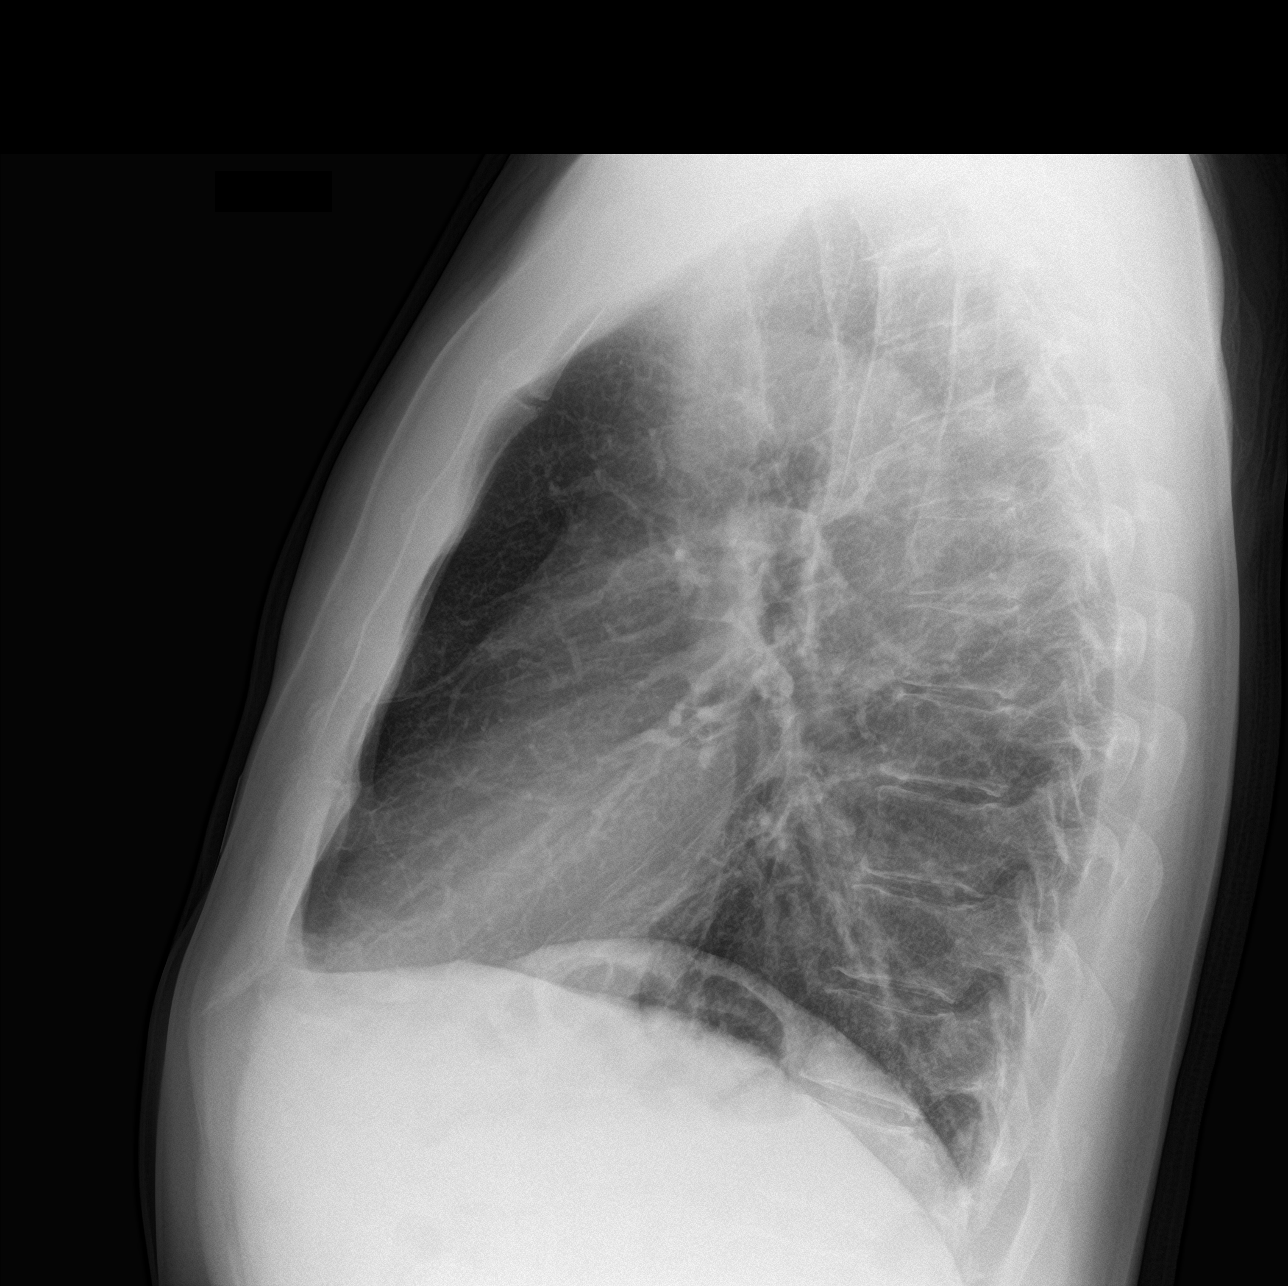

[3 of 3 positions shown; findings below may reference images not displayed]

FINDINGS: The cardiomediastinal contours are normal. The lungs are clear.
Pulmonary vasculature is normal. No consolidation, pleural effusion,
or pneumothorax. No acute osseous abnormalities are seen.
IMPRESSION: No acute chest findings.

## 2020-11-08 ENCOUNTER — Encounter: Payer: Self-pay | Admitting: General Surgery

## 2020-11-08 ENCOUNTER — Ambulatory Visit: Payer: BC Managed Care – PPO | Admitting: General Surgery

## 2020-11-08 ENCOUNTER — Other Ambulatory Visit: Payer: Self-pay

## 2020-11-08 VITALS — BP 124/75 | HR 86 | Temp 98.6°F | Resp 16 | Ht 77.0 in | Wt 189.0 lb

## 2020-11-08 DIAGNOSIS — K4090 Unilateral inguinal hernia, without obstruction or gangrene, not specified as recurrent: Secondary | ICD-10-CM | POA: Diagnosis not present

## 2020-11-08 NOTE — Progress Notes (Signed)
Craig [name]; [id number]; [birth year]   HPI Patient is a 60 year old white male who referred himself to my care for evaluation and treatment of right inguinal hernia.  Patient states he noted the hernia approximately 1 month ago.  It does cause him discomfort when standing for prolonged time or straining.  He denies any nausea or vomiting.  The pain seems localized to the right groin region. Past Medical History:  Diagnosis Date   Arthritis    Headache    occasional migraines treated with excedrin "a long time ago"   History of cardiac cath    a. cath in 10/2018 showing normal cors with preserved EF.    PONV (postoperative nausea and vomiting)    after knee surgeries    Past Surgical History:  Procedure Laterality Date   CARDIAC CATHETERIZATION  11/05/2018   KNEE SURGERY     "bone spurr or cartilage operations", x6   LEFT HEART CATH AND CORONARY ANGIOGRAPHY N/A 11/05/2018   Procedure: LEFT HEART CATH AND CORONARY ANGIOGRAPHY;  Surgeon: Marykay Lex, MD;  Location: [facility] INVASIVE CV LAB;  Service: Cardiovascular;  Laterality: N/A;   LUMBAR LAMINECTOMY/DECOMPRESSION MICRODISCECTOMY N/A 08/24/2015   Procedure: L3-4 CENTRAL DECOMPRESSION ;  Surgeon: Eldred Manges, MD;  Location: [facility] OR;  Service: Orthopedics;  Laterality: N/A;    Family History  Problem Relation Age of Onset   Atrial fibrillation Mother    Coronary artery disease Father        CABG in his 46's    No current outpatient medications on file prior to visit.   No current facility-administered medications on file prior to visit.    Allergies  Allergen Reactions   No Known Allergies    Atorvastatin Rash    Social History   Substance and Sexual Activity  Alcohol Use No    Social History   Tobacco Use  Smoking Status Former   Types: Cigars  Smokeless Tobacco Never  Tobacco Comments   daily cigar 2x/day    Review of Systems  Constitutional: Negative.   HENT: Negative.    Eyes: Negative.   Respiratory:  Negative.    Cardiovascular: Negative.   Gastrointestinal: Negative.   Genitourinary: Negative.   Musculoskeletal:  Positive for neck pain.  Skin: Negative.   Neurological: Negative.   Endo/Heme/Allergies: Negative.   Psychiatric/Behavioral: Negative.     Objective   Vitals:   11/08/20 1549  BP: 124/75  Pulse: 86  Resp: 16  Temp: 98.6 F (37 C)  SpO2: 94%    Physical Exam Vitals reviewed.  Constitutional:      Appearance: Normal appearance. He is normal weight. He is not ill-appearing.  HENT:     Head: Normocephalic and atraumatic.  Cardiovascular:     Rate and Rhythm: Normal rate and regular rhythm.     Heart sounds: Normal heart sounds. No murmur heard.   No friction rub. No gallop.  Pulmonary:     Effort: Pulmonary effort is normal. No respiratory distress.     Breath sounds: Normal breath sounds. No stridor. No wheezing, rhonchi or rales.  Abdominal:     General: Abdomen is flat. Bowel sounds are normal. There is no distension.     Palpations: Abdomen is soft. There is no mass.     Tenderness: There is no abdominal tenderness. There is no guarding or rebound.     Hernia: A hernia is present.     Comments: Easily reducible right inguinal hernia  Genitourinary:    Testes:  Normal.  Skin:    General: Skin is warm and dry.  Neurological:     Mental Status: He is alert and oriented to person, place, and time.    Assessment  Right inguinal hernia Plan  Patient will be scheduled for right inguinal herniorrhaphy with mesh on 11/11/2020.  The risks and benefits of the procedure including bleeding, infection, mesh use, and the possibility of recurrence of the hernia were fully explained to the patient, who gave informed consent.

## 2020-11-09 NOTE — Patient Instructions (Addendum)
Craig [name]  11/09/2020     @PREFPERIOPPHARMACY @   Your procedure is scheduled on 11/11/2020.   Report to [facility] at  1030 A.M.   Call this number if you have problems the morning of surgery:  [phone]   Remember:  Do not eat or drink after midnight.      Take these medicines the morning of surgery with A SIP OF WATER                                       None     Do not wear jewelry, make-up or nail polish.  Do not wear lotions, powders, or perfumes, or deodorant.  Do not shave 48 hours prior to surgery.  Men may shave face and neck.  Do not bring valuables to the hospital.  [facility] is not responsible for any belongings or valuables.  Contacts, dentures or bridgework may not be worn into surgery.  Leave your suitcase in the car.  After surgery it may be brought to your room.  For patients admitted to the hospital, discharge time will be determined by your treatment team.  Patients discharged the day of surgery will not be allowed to drive home and must have someone with them for 24 hours.    Special instructions:   DO NOT smoke tobacco or vape for 24 hours before your procedure.  Please read over the following fact sheets that you were given. Coughing and Deep Breathing, Surgical Site Infection Prevention, Anesthesia Post-op Instructions, and Care and Recovery After Surgery      Open Hernia Repair, Adult, Care After What can I expect after the procedure? After the procedure, it is common to have: Mild discomfort. Slight bruising. Mild swelling. Pain in the belly (abdomen). A small amount of blood from the cut from surgery (incision). Follow these instructions at home: Your doctor may give you more specific instructions. If you have problems, call your doctor. Medicines Take over-the-counter and prescription medicines only as told by your doctor. If told, take steps to prevent problems with pooping (constipation). You may need  to: Drink enough fluid to keep your pee (urine) pale yellow. Take medicines. You will be told what medicines to take. Eat foods that are high in fiber. These include beans, whole grains, and fresh fruits and vegetables. Limit foods that are high in fat and sugar. These include fried or sweet foods. Ask your doctor if you should avoid driving or using machines while you are taking your medicine. Incision care  Follow instructions from your doctor about how to take care of your incision. Make sure you: Wash your hands with soap and water for at least 20 seconds before and after you change your bandage (dressing). If you cannot use soap and water, use hand sanitizer. Change your bandage. Leave stitches or skin glue in place for at least 2 weeks. Leave tape strips alone unless you are told to take them off. You may trim the edges of the tape strips if they curl up. Check your incision every day for signs of infection. Check for: More redness, swelling, or pain. More fluid or blood. Warmth. Pus or a bad smell. Wear loose, soft clothing while your incision heals. Activity  Rest as told by your doctor. Do not lift anything that is heavier than 10 lb (4.5 kg), or the limit  that you are told. Do not play contact sports until your doctor says that this is safe. If you were given a sedative during your procedure, do not drive or use machines until your doctor says that it is safe. A sedative is a medicine that helps you relax. Return to your normal activities when your doctor says that it is safe. General instructions Do not take baths, swim, or use a hot tub. Ask your doctor about taking showers or sponge baths. Hold a pillow over your belly when you cough or sneeze. This helps with pain. Do not smoke or use any products that contain nicotine or tobacco. If you need help quitting, ask your doctor. Keep all follow-up visits. Contact a doctor if: You have any of these signs of infection in or  around your incision: More redness, swelling, or pain. More fluid or blood. Warmth. Pus. A bad smell. You have a fever or chills. You have blood in your poop (stool). You have not pooped (had a bowel movement) in 2-3 days. Medicine does not help your pain. Get help right away if: You have chest pain, or you are short of breath. You feel faint or light-headed. You have very bad pain. You vomit and your pain is worse. You have pain, swelling, or redness in a leg. These symptoms may be an emergency. Get help right away. Call your local emergency services (911 in the [place] Do not wait to see if the symptoms will go away. Do not drive yourself to the hospital. Summary After this procedure, it is common to have mild discomfort, slight bruising, and mild swelling. Follow instructions from your doctor about how to take care of your cut from surgery (incision). Check every day for signs of infection. Do not lift heavy objects or play contact sports until your doctor says it is safe. Return to your normal activities as told by your doctor. This information is not intended to replace advice given to you by your health care provider. Make sure you discuss any questions you have with your health care provider. Document Revised: 09/14/2019 Document Reviewed: 09/14/2019 Elsevier Patient Education  2022 Else[employer] Inc[employer] General Anesthesia, Adult, Care After This sheet gives you information about how to care for yourself after your procedure. Your health care provider may also give you more specific instructions. If you have problems or questions, contact your health care provider. What can I expect after the procedure? After the procedure, the following side effects are common: Pain or discomfort at the IV site. Nausea. Vomiting. Sore throat. Trouble concentrating. Feeling cold or chills. Feeling weak or tired. Sleepiness and fatigue. Soreness and body aches. These side effects can affect parts  of the body that were not involved in surgery. Follow these instructions at home: For the time period you were told by your health care provider:  Rest. Do not participate in activities where you could fall or become injured. Do not drive or use machinery. Do not drink alcohol. Do not take sleeping pills or medicines that cause drowsiness. Do not make important decisions or sign legal documents. Do not take care of children on your own. Eating and drinking Follow any instructions from your health care provider about eating or drinking restrictions. When you feel hungry, start by eating small amounts of foods that are soft and easy to digest (bland), such as toast. Gradually return to your regular diet. Drink enough fluid to keep your urine pale yellow. If you vomit, rehydrate by drinking water, juice,  or clear broth. General instructions If you have sleep apnea, surgery and certain medicines can increase your risk for breathing problems. Follow instructions from your health care provider about wearing your sleep device: Anytime you are sleeping, including during daytime naps. While taking prescription pain medicines, sleeping medicines, or medicines that make you drowsy. Have a responsible adult stay with you for the time you are told. It is important to have someone help care for you until you are awake and alert. Return to your normal activities as told by your health care provider. Ask your health care provider what activities are safe for you. Take over-the-counter and prescription medicines only as told by your health care provider. If you smoke, do not smoke without supervision. Keep all follow-up visits as told by your health care provider. This is important. Contact a health care provider if: You have nausea or vomiting that does not get better with medicine. You cannot eat or drink without vomiting. You have pain that does not get better with medicine. You are unable to pass  urine. You develop a skin rash. You have a fever. You have redness around your IV site that gets worse. Get help right away if: You have difficulty breathing. You have chest pain. You have blood in your urine or stool, or you vomit blood. Summary After the procedure, it is common to have a sore throat or nausea. It is also common to feel tired. Have a responsible adult stay with you for the time you are told. It is important to have someone help care for you until you are awake and alert. When you feel hungry, start by eating small amounts of foods that are soft and easy to digest (bland), such as toast. Gradually return to your regular diet. Drink enough fluid to keep your urine pale yellow. Return to your normal activities as told by your health care provider. Ask your health care provider what activities are safe for you. This information is not intended to replace advice given to you by your health care provider. Make sure you discuss any questions you have with your health care provider. Document Revised: 10/15/2019 Document Reviewed: 05/14/2019 Elsevier Patient Education  2022 Elsevier Inc. How to Use Chlorhexidine for Bathing Chlorhexidine gluconate (CHG) is a germ-killing (antiseptic) solution that is used to clean the skin. It can get rid of the bacteria that normally live on the skin and can keep them away for about 24 hours. To clean your skin with CHG, you may be given: A CHG solution to use in the shower or as part of a sponge bath. A prepackaged cloth that contains CHG. Cleaning your skin with CHG may help lower the risk for infection: While you are staying in the intensive care unit of the hospital. If you have a vascular access, such as a central line, to provide short-term or long-term access to your veins. If you have a catheter to drain urine from your bladder. If you are on a ventilator. A ventilator is a machine that helps you breathe by moving air in and out of your  lungs. After surgery. What are the risks? Risks of using CHG include: A skin reaction. Hearing loss, if CHG gets in your ears and you have a perforated eardrum. Eye injury, if CHG gets in your eyes and is not rinsed out. The CHG product catching fire. Make sure that you avoid smoking and flames after applying CHG to your skin. Do not use CHG: If you have  a chlorhexidine allergy or have previously reacted to chlorhexidine. On babies younger than 61 months of age. How to use CHG solution Use CHG only as told by your health care provider, and follow the instructions on the label. Use the full amount of CHG as directed. Usually, this is one bottle. During a shower Follow these steps when using CHG solution during a shower (unless your health care provider gives you different instructions): Start the shower. Use your normal soap and shampoo to wash your face and hair. Turn off the shower or move out of the shower stream. Pour the CHG onto a clean washcloth. Do not use any type of brush or rough-edged sponge. Starting at your neck, lather your body down to your toes. Make sure you follow these instructions: If you will be having surgery, pay special attention to the part of your body where you will be having surgery. Scrub this area for at least 1 minute. Do not use CHG on your head or face. If the solution gets into your ears or eyes, rinse them well with water. Avoid your genital area. Avoid any areas of skin that have broken skin, cuts, or scrapes. Scrub your back and under your arms. Make sure to wash skin folds. Let the lather sit on your skin for 1-2 minutes or as long as told by your health care provider. Thoroughly rinse your entire body in the shower. Make sure that all body creases and crevices are rinsed well. Dry off with a clean towel. Do not put any substances on your body afterward--such as powder, lotion, or perfume--unless you are told to do so by your health care provider.  Only use lotions that are recommended by the manufacturer. Put on clean clothes or pajamas. If it is the night before your surgery, sleep in clean sheets.  During a sponge bath Follow these steps when using CHG solution during a sponge bath (unless your health care provider gives you different instructions): Use your normal soap and shampoo to wash your face and hair. Pour the CHG onto a clean washcloth. Starting at your neck, lather your body down to your toes. Make sure you follow these instructions: If you will be having surgery, pay special attention to the part of your body where you will be having surgery. Scrub this area for at least 1 minute. Do not use CHG on your head or face. If the solution gets into your ears or eyes, rinse them well with water. Avoid your genital area. Avoid any areas of skin that have broken skin, cuts, or scrapes. Scrub your back and under your arms. Make sure to wash skin folds. Let the lather sit on your skin for 1-2 minutes or as long as told by your health care provider. Using a different clean, wet washcloth, thoroughly rinse your entire body. Make sure that all body creases and crevices are rinsed well. Dry off with a clean towel. Do not put any substances on your body afterward--such as powder, lotion, or perfume--unless you are told to do so by your health care provider. Only use lotions that are recommended by the manufacturer. Put on clean clothes or pajamas. If it is the night before your surgery, sleep in clean sheets. How to use CHG prepackaged cloths Only use CHG cloths as told by your health care provider, and follow the instructions on the label. Use the CHG cloth on clean, dry skin. Do not use the CHG cloth on your head or face unless your  health care provider tells you to. When washing with the CHG cloth: Avoid your genital area. Avoid any areas of skin that have broken skin, cuts, or scrapes. Before surgery Follow these steps when using a  CHG cloth to clean before surgery (unless your health care provider gives you different instructions): Using the CHG cloth, vigorously scrub the part of your body where you will be having surgery. Scrub using a back-and-forth motion for 3 minutes. The area on your body should be completely wet with CHG when you are done scrubbing. Do not rinse. Discard the cloth and let the area air-dry. Do not put any substances on the area afterward, such as powder, lotion, or perfume. Put on clean clothes or pajamas. If it is the night before your surgery, sleep in clean sheets.  For general bathing Follow these steps when using CHG cloths for general bathing (unless your health care provider gives you different instructions). Use a separate CHG cloth for each area of your body. Make sure you wash between any folds of skin and between your fingers and toes. Wash your body in the following order, switching to a new cloth after each step: The front of your neck, shoulders, and chest. Both of your arms, under your arms, and your hands. Your stomach and groin area, avoiding the genitals. Your right leg and foot. Your left leg and foot. The back of your neck, your back, and your buttocks. Do not rinse. Discard the cloth and let the area air-dry. Do not put any substances on your body afterward--such as powder, lotion, or perfume--unless you are told to do so by your health care provider. Only use lotions that are recommended by the manufacturer. Put on clean clothes or pajamas. Contact a health care provider if: Your skin gets irritated after scrubbing. You have questions about using your solution or cloth. You swallow any chlorhexidine. Call your local poison control center [phone])787-2440 in the [place] Get help right away if: Your eyes itch badly, or they become very red or swollen. Your skin itches badly and is red or swollen. Your hearing changes. You have trouble seeing. You have swelling or tingling in  your mouth or throat. You have trouble breathing. These symptoms may represent a serious problem that is an emergency. Do not wait to see if the symptoms will go away. Get medical help right away. Call your local emergency services (911 in the [place] Do not drive yourself to the hospital. Summary Chlorhexidine gluconate (CHG) is a germ-killing (antiseptic) solution that is used to clean the skin. Cleaning your skin with CHG may help to lower your risk for infection. You may be given CHG to use for bathing. It may be in a bottle or in a prepackaged cloth to use on your skin. Carefully follow your health care provider's instructions and the instructions on the product label. Do not use CHG if you have a chlorhexidine allergy. Contact your health care provider if your skin gets irritated after scrubbing. This information is not intended to replace advice given to you by your health care provider. Make sure you discuss any questions you have with your health care provider. Document Revised: 04/11/2020 Document Reviewed: 04/11/2020 Elsevier Patient Education  2022 [employer].

## 2020-11-09 NOTE — H&P (Signed)
Craig [name]; [id number]; [birth year]   HPI Patient is a 60 year old white male who referred himself to my care for evaluation and treatment of right inguinal hernia.  Patient states he noted the hernia approximately 1 month ago.  It does cause him discomfort when standing for prolonged time or straining.  He denies any nausea or vomiting.  The pain seems localized to the right groin region. Past Medical History:  Diagnosis Date   Arthritis    Headache    occasional migraines treated with excedrin "a long time ago"   History of cardiac cath    a. cath in 10/2018 showing normal cors with preserved EF.    PONV (postoperative nausea and vomiting)    after knee surgeries    Past Surgical History:  Procedure Laterality Date   CARDIAC CATHETERIZATION  11/05/2018   KNEE SURGERY     "bone spurr or cartilage operations", x6   LEFT HEART CATH AND CORONARY ANGIOGRAPHY N/A 11/05/2018   Procedure: LEFT HEART CATH AND CORONARY ANGIOGRAPHY;  Surgeon: Marykay Lex, MD;  Location: [facility] INVASIVE CV LAB;  Service: Cardiovascular;  Laterality: N/A;   LUMBAR LAMINECTOMY/DECOMPRESSION MICRODISCECTOMY N/A 08/24/2015   Procedure: L3-4 CENTRAL DECOMPRESSION ;  Surgeon: Eldred Manges, MD;  Location: [facility] OR;  Service: Orthopedics;  Laterality: N/A;    Family History  Problem Relation Age of Onset   Atrial fibrillation Mother    Coronary artery disease Father        CABG in his 46's    No current outpatient medications on file prior to visit.   No current facility-administered medications on file prior to visit.    Allergies  Allergen Reactions   No Known Allergies    Atorvastatin Rash    Social History   Substance and Sexual Activity  Alcohol Use No    Social History   Tobacco Use  Smoking Status Former   Types: Cigars  Smokeless Tobacco Never  Tobacco Comments   daily cigar 2x/day    Review of Systems  Constitutional: Negative.   HENT: Negative.    Eyes: Negative.   Respiratory:  Negative.    Cardiovascular: Negative.   Gastrointestinal: Negative.   Genitourinary: Negative.   Musculoskeletal:  Positive for neck pain.  Skin: Negative.   Neurological: Negative.   Endo/Heme/Allergies: Negative.   Psychiatric/Behavioral: Negative.     Objective   Vitals:   11/08/20 1549  BP: 124/75  Pulse: 86  Resp: 16  Temp: 98.6 F (37 C)  SpO2: 94%    Physical Exam Vitals reviewed.  Constitutional:      Appearance: Normal appearance. He is normal weight. He is not ill-appearing.  HENT:     Head: Normocephalic and atraumatic.  Cardiovascular:     Rate and Rhythm: Normal rate and regular rhythm.     Heart sounds: Normal heart sounds. No murmur heard.   No friction rub. No gallop.  Pulmonary:     Effort: Pulmonary effort is normal. No respiratory distress.     Breath sounds: Normal breath sounds. No stridor. No wheezing, rhonchi or rales.  Abdominal:     General: Abdomen is flat. Bowel sounds are normal. There is no distension.     Palpations: Abdomen is soft. There is no mass.     Tenderness: There is no abdominal tenderness. There is no guarding or rebound.     Hernia: A hernia is present.     Comments: Easily reducible right inguinal hernia  Genitourinary:    Testes:  Normal.  Skin:    General: Skin is warm and dry.  Neurological:     Mental Status: He is alert and oriented to person, place, and time.    Assessment  Right inguinal hernia Plan  Patient will be scheduled for right inguinal herniorrhaphy with mesh on 11/11/2020.  The risks and benefits of the procedure including bleeding, infection, mesh use, and the possibility of recurrence of the hernia were fully explained to the patient, who gave informed consent.

## 2020-11-10 ENCOUNTER — Other Ambulatory Visit: Payer: Self-pay

## 2020-11-10 ENCOUNTER — Encounter (HOSPITAL_COMMUNITY): Payer: Self-pay

## 2020-11-10 ENCOUNTER — Encounter (HOSPITAL_COMMUNITY)
Admission: RE | Admit: 2020-11-10 | Discharge: 2020-11-10 | Disposition: A | Discharge status: Home / Self Care | Payer: BC Managed Care – PPO | Source: Ambulatory Visit | Attending: General Surgery | Admitting: General Surgery

## 2020-11-10 DIAGNOSIS — Z888 Allergy status to other drugs, medicaments and biological substances status: Secondary | ICD-10-CM | POA: Diagnosis not present

## 2020-11-10 DIAGNOSIS — K4090 Unilateral inguinal hernia, without obstruction or gangrene, not specified as recurrent: Secondary | ICD-10-CM | POA: Diagnosis present

## 2020-11-10 DIAGNOSIS — Z87891 Personal history of nicotine dependence: Secondary | ICD-10-CM | POA: Diagnosis not present

## 2020-11-10 DIAGNOSIS — Z01810 Encounter for preprocedural cardiovascular examination: Secondary | ICD-10-CM | POA: Insufficient documentation

## 2020-11-10 DIAGNOSIS — N433 Hydrocele, unspecified: Secondary | ICD-10-CM | POA: Diagnosis not present

## 2020-11-11 ENCOUNTER — Ambulatory Visit (HOSPITAL_COMMUNITY)
Admission: RE | Admit: 2020-11-11 | Discharge: 2020-11-11 | Disposition: A | Discharge status: Home / Self Care | Payer: BC Managed Care – PPO | Source: Ambulatory Visit | Attending: General Surgery | Admitting: General Surgery

## 2020-11-11 ENCOUNTER — Encounter (HOSPITAL_COMMUNITY)
Admission: RE | Disposition: A | Discharge status: Home / Self Care | Payer: Self-pay | Source: Ambulatory Visit | Attending: General Surgery

## 2020-11-11 ENCOUNTER — Ambulatory Visit (HOSPITAL_COMMUNITY): Payer: BC Managed Care – PPO | Admitting: Anesthesiology

## 2020-11-11 ENCOUNTER — Encounter (HOSPITAL_COMMUNITY): Payer: Self-pay | Admitting: General Surgery

## 2020-11-11 DIAGNOSIS — K4090 Unilateral inguinal hernia, without obstruction or gangrene, not specified as recurrent: Secondary | ICD-10-CM

## 2020-11-11 DIAGNOSIS — Z888 Allergy status to other drugs, medicaments and biological substances status: Secondary | ICD-10-CM | POA: Insufficient documentation

## 2020-11-11 DIAGNOSIS — N433 Hydrocele, unspecified: Secondary | ICD-10-CM | POA: Diagnosis not present

## 2020-11-11 DIAGNOSIS — Z87891 Personal history of nicotine dependence: Secondary | ICD-10-CM | POA: Insufficient documentation

## 2020-11-11 HISTORY — PX: INGUINAL HERNIA REPAIR: SHX194

## 2020-11-11 SURGERY — REPAIR, HERNIA, INGUINAL, ADULT
Anesthesia: General | Site: Groin | Laterality: Right

## 2020-11-11 MED ORDER — DEXAMETHASONE SODIUM PHOSPHATE 10 MG/ML IJ SOLN
INTRAMUSCULAR | Status: DC | PRN
  Administered 2020-11-11: 10 mg via INTRAVENOUS

## 2020-11-11 MED ORDER — SODIUM CHLORIDE 0.9 % IR SOLN
Status: DC | PRN
  Administered 2020-11-11: 1

## 2020-11-11 MED ORDER — ONDANSETRON HCL 4 MG/2ML IJ SOLN: 4.0000 mg | Freq: Once | INTRAMUSCULAR | Status: DC | PRN

## 2020-11-11 MED ORDER — LIDOCAINE HCL (PF) 2 % IJ SOLN
INTRAMUSCULAR | Status: AC
  Filled 2020-11-11: qty 5

## 2020-11-11 MED ORDER — MIDAZOLAM HCL 2 MG/2ML IJ SOLN
INTRAMUSCULAR | Status: AC
  Filled 2020-11-11: qty 2

## 2020-11-11 MED ORDER — ONDANSETRON HCL 4 MG/2ML IJ SOLN
INTRAMUSCULAR | Status: DC | PRN
  Administered 2020-11-11: 4 mg via INTRAVENOUS

## 2020-11-11 MED ORDER — FENTANYL CITRATE (PF) 250 MCG/5ML IJ SOLN
INTRAMUSCULAR | Status: AC
  Filled 2020-11-11: qty 5

## 2020-11-11 MED ORDER — FENTANYL CITRATE (PF) 250 MCG/5ML IJ SOLN
INTRAMUSCULAR | Status: DC | PRN
  Administered 2020-11-11: 25 ug via INTRAVENOUS
  Administered 2020-11-11: 50 ug via INTRAVENOUS
  Administered 2020-11-11: 75 ug via INTRAVENOUS
  Administered 2020-11-11: 50 ug via INTRAVENOUS

## 2020-11-11 MED ORDER — CHLORHEXIDINE GLUCONATE CLOTH 2 % EX PADS: 6.0000 | MEDICATED_PAD | Freq: Once | CUTANEOUS | Status: DC

## 2020-11-11 MED ORDER — CEFAZOLIN SODIUM-DEXTROSE 2-4 GM/100ML-% IV SOLN
2.0000 g | INTRAVENOUS | Status: AC
  Administered 2020-11-11: 2 g via INTRAVENOUS

## 2020-11-11 MED ORDER — ORAL CARE MOUTH RINSE: 15.0000 mL | Freq: Once | OROMUCOSAL | Status: AC

## 2020-11-11 MED ORDER — MEPERIDINE HCL 50 MG/ML IJ SOLN: 6.2500 mg | INTRAMUSCULAR | Status: DC | PRN

## 2020-11-11 MED ORDER — BUPIVACAINE HCL (300 MG DOSE) 3 X 100 MG IL IMPL
DRUG_IMPLANT | Status: AC
  Filled 2020-11-11: qty 100

## 2020-11-11 MED ORDER — CHLORHEXIDINE GLUCONATE 0.12 % MT SOLN
15.0000 mL | Freq: Once | OROMUCOSAL | Status: AC
  Administered 2020-11-11: 15 mL via OROMUCOSAL

## 2020-11-11 MED ORDER — KETOROLAC TROMETHAMINE 30 MG/ML IJ SOLN
30.0000 mg | Freq: Once | INTRAMUSCULAR | Status: AC
  Administered 2020-11-11: 30 mg via INTRAVENOUS
  Filled 2020-11-11: qty 1

## 2020-11-11 MED ORDER — PROPOFOL 10 MG/ML IV BOLUS
INTRAVENOUS | Status: DC | PRN
  Administered 2020-11-11: 300 mg via INTRAVENOUS

## 2020-11-11 MED ORDER — CEFAZOLIN SODIUM-DEXTROSE 2-4 GM/100ML-% IV SOLN
INTRAVENOUS | Status: AC
  Filled 2020-11-11: qty 100

## 2020-11-11 MED ORDER — HYDROMORPHONE HCL 1 MG/ML IJ SOLN: 0.2500 mg | INTRAMUSCULAR | Status: DC | PRN

## 2020-11-11 MED ORDER — HYDROCODONE-ACETAMINOPHEN 5-325 MG PO TABS: 1.0000 | ORAL_TABLET | ORAL | 0 refills | Status: DC | PRN

## 2020-11-11 MED ORDER — BUPIVACAINE HCL (300 MG DOSE) 3 X 100 MG IL IMPL
DRUG_IMPLANT | Status: DC | PRN
  Administered 2020-11-11: 200 mg

## 2020-11-11 MED ORDER — PROPOFOL 10 MG/ML IV BOLUS
INTRAVENOUS | Status: AC
  Filled 2020-11-11: qty 20

## 2020-11-11 MED ORDER — LACTATED RINGERS IV SOLN
INTRAVENOUS | Status: DC
  Administered 2020-11-11: 1000 mL via INTRAVENOUS

## 2020-11-11 MED ORDER — MIDAZOLAM HCL 2 MG/2ML IJ SOLN
INTRAMUSCULAR | Status: DC | PRN
  Administered 2020-11-11: 2 mg via INTRAVENOUS

## 2020-11-11 MED ORDER — ONDANSETRON HCL 4 MG/2ML IJ SOLN
INTRAMUSCULAR | Status: AC
  Filled 2020-11-11: qty 2

## 2020-11-11 MED ORDER — LIDOCAINE HCL (CARDIAC) PF 100 MG/5ML IV SOSY
PREFILLED_SYRINGE | INTRAVENOUS | Status: DC | PRN
  Administered 2020-11-11: 80 mg via INTRAVENOUS

## 2020-11-11 SURGICAL SUPPLY — 32 items
ADH SKN CLS APL DERMABOND .7 (GAUZE/BANDAGES/DRESSINGS) ×1
CLOTH BEACON ORANGE TIMEOUT ST (SAFETY) ×2 IMPLANT
COVER LIGHT HANDLE STERIS (MISCELLANEOUS) ×4 IMPLANT
DERMABOND ADVANCED (GAUZE/BANDAGES/DRESSINGS) ×1
DERMABOND ADVANCED .7 DNX12 (GAUZE/BANDAGES/DRESSINGS) ×1 IMPLANT
DRAIN PENROSE 0.5X18 (DRAIN) ×2 IMPLANT
ELECT REM PT RETURN 9FT ADLT (ELECTROSURGICAL) ×2
ELECTRODE REM PT RTRN 9FT ADLT (ELECTROSURGICAL) ×1 IMPLANT
GAUZE SPONGE 4X4 12PLY STRL (GAUZE/BANDAGES/DRESSINGS) ×2 IMPLANT
GLOVE SURG POLYISO LF SZ7 (GLOVE) ×1 IMPLANT
GLOVE SURG POLYISO LF SZ7.5 (GLOVE) ×2 IMPLANT
GLOVE SURG UNDER POLY LF SZ7 (GLOVE) ×6 IMPLANT
GOWN STRL REUS W/TWL LRG LVL3 (GOWN DISPOSABLE) ×6 IMPLANT
INST SET MINOR GENERAL (KITS) ×2 IMPLANT
KIT TURNOVER KIT A (KITS) ×2 IMPLANT
MANIFOLD NEPTUNE II (INSTRUMENTS) ×2 IMPLANT
MESH MARLEX PLUG MEDIUM (Mesh General) ×1 IMPLANT
NS IRRIG 1000ML POUR BTL (IV SOLUTION) ×2 IMPLANT
PACK MINOR (CUSTOM PROCEDURE TRAY) ×2 IMPLANT
PAD ARMBOARD 7.5X6 YLW CONV (MISCELLANEOUS) ×2 IMPLANT
SET BASIN LINEN APH (SET/KITS/TRAYS/PACK) ×2 IMPLANT
SOL PREP PROV IODINE SCRUB 4OZ (MISCELLANEOUS) ×2 IMPLANT
SUT MNCRL AB 4-0 PS2 18 (SUTURE) ×2 IMPLANT
SUT NOVA NAB GS-22 2 2-0 T-19 (SUTURE) ×4 IMPLANT
SUT SILK 3 0 (SUTURE)
SUT SILK 3-0 18XBRD TIE 12 (SUTURE) IMPLANT
SUT VIC AB 2-0 CT1 27 (SUTURE) ×2
SUT VIC AB 2-0 CT1 TAPERPNT 27 (SUTURE) ×1 IMPLANT
SUT VIC AB 3-0 SH 27 (SUTURE) ×4
SUT VIC AB 3-0 SH 27X BRD (SUTURE) ×1 IMPLANT
SUT VICRYL AB 2 0 TIES (SUTURE) IMPLANT
SUT VICRYL AB 3 0 TIES (SUTURE) ×1 IMPLANT

## 2020-11-11 NOTE — Anesthesia Preprocedure Evaluation (Signed)
Anesthesia Evaluation  Patient identified by MRN, date of birth, ID band Patient awake    Reviewed: Allergy & Precautions, NPO status , Patient's Chart, lab work & pertinent test results  History of Anesthesia Complications (+) PONV and history of anesthetic complications  Airway Mallampati: II  TM Distance: >3 FB Neck ROM: Full    Dental  (+) Dental Advisory Given, Missing, Chipped   Pulmonary former smoker,    Pulmonary exam normal breath sounds clear to auscultation       Cardiovascular Exercise Tolerance: Good Normal cardiovascular exam Rhythm:Regular Rate:Normal  Elevated troponin, cath in 10/2018 showing normal coronaries  with preserved EF  10-Nov-2020 08:34:24 [facility]-OPS ROUTINE RECORD 19-May-1960 (59 yr) Male Caucasian: Vent. rate 75 BPM PR interval 166 ms QRS duration 98 ms QT/QTcB 386/431 ms P-R-T axes 79 90 83 Sinus rhythm with occasional Premature ventricular complexes Right atrial enlargement Rightward axis Borderline ECG No significant change since last tracing Confirmed by Rinaldo Cloud ([id number]) on 11/10/2020 9:38:12 PM   Neuro/Psych  Headaches, negative psych ROS   GI/Hepatic negative GI ROS, Neg liver ROS,   Endo/Other  negative endocrine ROS  Renal/GU negative Renal ROS     Musculoskeletal  (+) Arthritis , Osteoarthritis,    Abdominal   Peds  Hematology negative hematology ROS (+)   Anesthesia Other Findings   Reproductive/Obstetrics negative OB ROS                            Anesthesia Physical Anesthesia Plan  ASA: 2  Anesthesia Plan: General   Post-op Pain Management:    Induction: Intravenous  PONV Risk Score and Plan: 4 or greater and Ondansetron and Dexamethasone  Airway Management Planned: LMA  Additional Equipment:   Intra-op Plan:   Post-operative Plan: Extubation in OR  Informed Consent: I have reviewed the  patients History and Physical, chart, labs and discussed the procedure including the risks, benefits and alternatives for the proposed anesthesia with the patient or authorized representative who has indicated his/her understanding and acceptance.     Dental advisory given  Plan Discussed with: CRNA and Surgeon  Anesthesia Plan Comments:         Anesthesia Quick Evaluation

## 2020-11-11 NOTE — Op Note (Signed)
Patient:  Craig [name]  DOB:  [birth year]  MRN:  [id number]   Preop Diagnosis: Right inguinal hernia with hydrocele  Postop Diagnosis: Same  Procedure: Right inguinal herniorrhaphy with mesh, hydrocelectomy  Surgeon: Franky Macho, MD  Anes: General  Indications: Patient is a 60 year old white male who presents with both a right inguinal hernia as well as an associated right hydrocele.  The risks and benefits of the procedure including bleeding, infection, mesh use, and the possibility of recurrence of the hernia were fully explained to the patient, who gave informed consent.  Procedure note: The patient was placed in supine position.  After general anesthesia was administered, the right groin region was prepped and draped using the usual sterile technique with Betadine.  Surgical site confirmation was performed.  Incision was made in the right groin region down to the external oblique aponeurosis.  The aponeurosis was incised to the external ring.  A Penrose drain was placed around the spermatic cord.  The vas deferens was noted within the spermatic cord.  The patient did have an indirect hernia sac.  This was freed away from the spermatic cord up to the peritoneal reflection and inverted.  The patient also had a large right hydrocele.  The right testicle was delivered into the wound and the hydrocelectomy was performed.  The testicle was then returned back into the scrotum.  Care was taken not to twist the testicle when he was being repositioned.  A medium size Bard PerFix plug was then placed.  An onlay patch was placed along the floor of the inguinal canal and secured superiorly to the conjoined tendon and inferiorly to the shelving edge of Poupart's ligament using 2-0 Novafil interrupted sutures.  The internal ring was recreated using a 2-0 Novafil interrupted suture.  [name] was placed over the mesh and in the subcutaneous layer.  The external oblique aponeurosis was reapproximated using  a 2-0 Vicryl running suture.  Subcutaneous layer was reapproximated using 3-0 Vicryl interrupted sutures.  The skin was closed using a 4-0 Monocryl subcuticular suture.  Dermabond was applied.  All tape and needle counts were correct at the end of the procedure.  The patient was awakened and transferred to PACU in stable condition.  Complications: None  EBL: Minimal  Specimen: None

## 2020-11-11 NOTE — Transfer of Care (Signed)
Immediate Anesthesia Transfer of Care Note  Patient: IWAO SHAMBLIN  Procedure(s) Performed: HERNIA REPAIR INGUINAL ADULT (Right: Groin)  Patient Location: PACU  Anesthesia Type:General  Level of Consciousness: drowsy  Airway & Oxygen Therapy: Patient Spontanous Breathing and Patient connected to nasal cannula oxygen  Post-op Assessment: Report given to RN and Post -op Vital signs reviewed and stable  Post vital signs: Reviewed and stable  Last Vitals:  Vitals Value Taken Time  BP 109/62   Temp    Pulse 66 11/11/20 0849  Resp 13   SpO2 94 % 11/11/20 0849  Vitals shown include unvalidated device data.  Last Pain:  Vitals:   11/11/20 0724  TempSrc: Oral  PainSc: 0-No pain      Patients Stated Pain Goal: 6 (11/11/20 0724)  Complications: No notable events documented.

## 2020-11-11 NOTE — Interval H&P Note (Signed)
History and Physical Interval Note:  11/11/2020 7:15 AM  Craig [name]  has presented today for surgery, with the diagnosis of Right inguinal hernia.  The various methods of treatment have been discussed with the patient and family. After consideration of risks, benefits and other options for treatment, the patient has consented to  Procedure(s): HERNIA REPAIR INGUINAL ADULT (Right) as a surgical intervention.  The patient's history has been reviewed, patient examined, no change in status, stable for surgery.  I have reviewed the patient's chart and labs.  Questions were answered to the patient's satisfaction.     Franky Macho

## 2020-11-11 NOTE — Anesthesia Procedure Notes (Signed)
Procedure Name: LMA Insertion Date/Time: 11/11/2020 7:50 AM Performed by: Lorin Glass, CRNA Pre-anesthesia Checklist: Emergency Drugs available, Suction available, Patient being monitored and Patient identified Patient Re-evaluated:Patient Re-evaluated prior to induction Oxygen Delivery Method: Circle system utilized Preoxygenation: Pre-oxygenation with 100% oxygen Induction Type: IV induction LMA: LMA inserted LMA Size: 4.0 Number of attempts: 1 Placement Confirmation: breath sounds checked- equal and bilateral and positive ETCO2 Tube secured with: Tape Dental Injury: Teeth and Oropharynx as per pre-operative assessment

## 2020-11-11 NOTE — Anesthesia Postprocedure Evaluation (Signed)
Anesthesia Post Note  Patient: Craig [name]  Procedure(s) Performed: HERNIA REPAIR INGUINAL ADULT (Right: Groin)  Patient location during evaluation: PACU Anesthesia Type: General Level of consciousness: awake and alert and oriented Pain management: pain level controlled Vital Signs Assessment: post-procedure vital signs reviewed and stable Respiratory status: spontaneous breathing, nonlabored ventilation, respiratory function stable and patient connected to nasal cannula oxygen Cardiovascular status: blood pressure returned to baseline and stable Postop Assessment: no apparent nausea or vomiting Anesthetic complications: no   No notable events documented.   Last Vitals:  Vitals:   11/11/20 0915 11/11/20 0930  BP: 120/80 122/81  Pulse: (!) 58 63  Resp: 14 15  Temp:    SpO2: 98% 96%    Last Pain:  Vitals:   11/11/20 0915  TempSrc:   PainSc: 0-No pain                 Mikeila Burgen C Niti Leisure

## 2020-11-14 ENCOUNTER — Encounter (HOSPITAL_COMMUNITY): Payer: Self-pay | Admitting: General Surgery

## 2020-11-16 ENCOUNTER — Encounter (HOSPITAL_COMMUNITY): Payer: Self-pay | Admitting: General Surgery

## 2020-11-21 ENCOUNTER — Telehealth (INDEPENDENT_AMBULATORY_CARE_PROVIDER_SITE_OTHER): Payer: BC Managed Care – PPO | Admitting: General Surgery

## 2020-11-21 DIAGNOSIS — Z09 Encounter for follow-up examination after completed treatment for conditions other than malignant neoplasm: Secondary | ICD-10-CM

## 2020-11-21 NOTE — Telephone Encounter (Signed)
Message left with patient

## 2020-12-15 ENCOUNTER — Encounter (HOSPITAL_COMMUNITY): Payer: Self-pay | Admitting: *Deleted

## 2020-12-15 ENCOUNTER — Emergency Department (HOSPITAL_COMMUNITY)
Admission: EM | Admit: 2020-12-15 | Discharge: 2020-12-15 | Disposition: A | Discharge status: Home / Self Care | Payer: BC Managed Care – PPO | Attending: Emergency Medicine | Admitting: Emergency Medicine

## 2020-12-15 ENCOUNTER — Emergency Department (HOSPITAL_COMMUNITY): Payer: BC Managed Care – PPO

## 2020-12-15 DIAGNOSIS — Z7982 Long term (current) use of aspirin: Secondary | ICD-10-CM | POA: Insufficient documentation

## 2020-12-15 DIAGNOSIS — Z87891 Personal history of nicotine dependence: Secondary | ICD-10-CM | POA: Insufficient documentation

## 2020-12-15 DIAGNOSIS — I4891 Unspecified atrial fibrillation: Secondary | ICD-10-CM | POA: Insufficient documentation

## 2020-12-15 DIAGNOSIS — R002 Palpitations: Secondary | ICD-10-CM | POA: Diagnosis present

## 2020-12-15 LAB — CBC WITH DIFFERENTIAL/PLATELET
Abs Immature Granulocytes: 0.03 10*3/uL (ref 0.00–0.07)
Basophils Absolute: 0.1 10*3/uL (ref 0.0–0.1)
Basophils Relative: 1 %
Eosinophils Absolute: 0.2 10*3/uL (ref 0.0–0.5)
Eosinophils Relative: 2 %
HCT: 41.2 % (ref 39.0–52.0)
Hemoglobin: 13.8 g/dL (ref 13.0–17.0)
Immature Granulocytes: 0 %
Lymphocytes Relative: 22 %
Lymphs Abs: 1.8 10*3/uL (ref 0.7–4.0)
MCH: 30.5 pg (ref 26.0–34.0)
MCHC: 33.5 g/dL (ref 30.0–36.0)
MCV: 90.9 fL (ref 80.0–100.0)
Monocytes Absolute: 0.8 10*3/uL (ref 0.1–1.0)
Monocytes Relative: 10 %
Neutro Abs: 5.2 10*3/uL (ref 1.7–7.7)
Neutrophils Relative %: 65 %
Platelets: 251 10*3/uL (ref 150–400)
RBC: 4.53 MIL/uL (ref 4.22–5.81)
RDW: 14.6 % (ref 11.5–15.5)
WBC: 8 10*3/uL (ref 4.0–10.5)
nRBC: 0 % (ref 0.0–0.2)

## 2020-12-15 LAB — COMPREHENSIVE METABOLIC PANEL
ALT: 15 U/L (ref 0–44)
AST: 21 U/L (ref 15–41)
Albumin: 4.3 g/dL (ref 3.5–5.0)
Alkaline Phosphatase: 76 U/L (ref 38–126)
Anion gap: 6 (ref 5–15)
BUN: 15 mg/dL (ref 6–20)
CO2: 26 mmol/L (ref 22–32)
Calcium: 9.1 mg/dL (ref 8.9–10.3)
Chloride: 105 mmol/L (ref 98–111)
Creatinine, Ser: 0.89 mg/dL (ref 0.61–1.24)
GFR, Estimated: >60 mL/min (ref >60)
Glucose, Bld: 123 mg/dL — ABNORMAL HIGH (ref 70–99)
Potassium: 4.3 mmol/L (ref 3.5–5.1)
Sodium: 137 mmol/L (ref 135–145)
Total Bilirubin: 0.4 mg/dL (ref 0.3–1.2)
Total Protein: 7.2 g/dL (ref 6.5–8.1)

## 2020-12-15 LAB — TROPONIN I (HIGH SENSITIVITY)
Troponin I (High Sensitivity): 5 ng/L (ref <18)
Troponin I (High Sensitivity): 7 ng/L (ref <18)

## 2020-12-15 LAB — MAGNESIUM: Magnesium: 2.3 mg/dL (ref 1.7–2.4)

## 2020-12-15 LAB — TSH: TSH: 1.21 u[IU]/mL (ref 0.350–4.500)

## 2020-12-15 LAB — BRAIN NATRIURETIC PEPTIDE: B Natriuretic Peptide: 42 pg/mL (ref 0.0–100.0)

## 2020-12-15 MED ORDER — SODIUM CHLORIDE 0.9 % IV BOLUS
500.0000 mL | Freq: Once | INTRAVENOUS | Status: AC
  Administered 2020-12-15: 500 mL via INTRAVENOUS

## 2020-12-15 MED ORDER — HEPARIN BOLUS VIA INFUSION
4000.0000 [IU] | Freq: Once | INTRAVENOUS | Status: AC
  Administered 2020-12-15: 4000 [IU] via INTRAVENOUS

## 2020-12-15 MED ORDER — APIXABAN 5 MG PO TABS
5.0000 mg | ORAL_TABLET | Freq: Two times a day (BID) | ORAL | Status: DC
  Administered 2020-12-15: 5 mg via ORAL
  Filled 2020-12-15: qty 1

## 2020-12-15 MED ORDER — DILTIAZEM LOAD VIA INFUSION
15.0000 mg | Freq: Once | INTRAVENOUS | Status: DC
  Filled 2020-12-15: qty 15

## 2020-12-15 MED ORDER — DILTIAZEM HCL-DEXTROSE 125-5 MG/125ML-% IV SOLN (PREMIX)
5.0000 mg/h | INTRAVENOUS | Status: DC
  Administered 2020-12-15: 5 mg/h via INTRAVENOUS
  Filled 2020-12-15: qty 125

## 2020-12-15 MED ORDER — APIXABAN 5 MG PO TABS: 5.0000 mg | ORAL_TABLET | Freq: Two times a day (BID) | ORAL | 0 refills | Status: DC

## 2020-12-15 MED ORDER — DILTIAZEM HCL 30 MG PO TABS: 30.0000 mg | ORAL_TABLET | Freq: Two times a day (BID) | ORAL | 0 refills | Status: DC

## 2020-12-15 MED ORDER — HEPARIN (PORCINE) 25000 UT/250ML-% IV SOLN
1200.0000 [IU]/h | INTRAVENOUS | Status: DC
  Administered 2020-12-15: 1200 [IU]/h via INTRAVENOUS
  Filled 2020-12-15: qty 250

## 2020-12-15 NOTE — Progress Notes (Signed)
ANTICOAGULATION CONSULT NOTE - Initial Consult  Pharmacy Consult for Heparin Indication: atrial fibrillation  Allergies  Allergen Reactions   Atorvastatin Rash    Patient Measurements: Height: 6' 5.01" (195.6 cm) Weight: 83.9 kg (184 lb 15.5 oz) IBW/kg (Calculated) : 89.12 HEPARIN DW (KG): 83.9   Vital Signs: Temp: 98.5 F (36.9 C) (11/03 1148) Temp Source: Oral (11/03 1148) BP: 102/72 (11/03 1148) Pulse Rate: 119 (11/03 1148)  Labs: No results for input(s): HGB, HCT, PLT, APTT, LABPROT, INR, HEPARINUNFRC, HEPRLOWMOCWT, CREATININE, CKTOTAL, CKMB, TROPONINIHS in the last 72 hours.  CrCl cannot be calculated (Patient's most recent lab result is older than the maximum 21 days allowed.).   Medical History: Past Medical History:  Diagnosis Date   Arthritis    Headache    occasional migraines treated with excedrin "a long time ago"   History of cardiac cath    a. cath in 10/2018 showing normal cors with preserved EF.    PONV (postoperative nausea and vomiting)    after knee surgeries    Medications:  See med rec  Assessment: Patient presented to ED with irregular heart beat. No previous history of afib. Pharmacy asked to start heparin for new onset afib. Not on any oral anticoagulants  Goal of Therapy:  Heparin level 0.3-0.7 units/ml Monitor platelets by anticoagulation protocol: Yes   Plan:  Give 4000 units bolus x 1 Start heparin infusion at 1200 units/hr Check anti-Xa level in ~6 hours and daily while on heparin Continue to monitor H&H and platelets  Elder Cyphers, BS Loura Back, BCPS Clinical Pharmacist Pager [phone] 12/15/2020,12:21 PM

## 2020-12-15 NOTE — ED Notes (Signed)
Pts HR has come down and remains 88-96

## 2020-12-15 NOTE — Discharge Instructions (Signed)
You were seen in the emergency department today with atrial fibrillation.  I am starting you on a blood thinner.  Please do not take aspirin or other NSAID medications while on this medicine.

## 2020-12-15 NOTE — ED Provider Notes (Signed)
Emergency Department Provider Note   I have reviewed the triage vital signs and the nursing notes.   HISTORY  Chief Complaint Irregular Heart Beat   HPI Craig [name] is a 60 y.o. male past history reviewed below presents emergency department with heart palpitations today.  Patient has had intermittent symptoms, similar to this, for the past year.  He does not have a known history of A. fib but states that his wife is an NP and felt like this was probably happening to him intermittently after her at home evaluations.  She is encouraged him to follow with a cardiologist for diagnosis/treatment but he has not, as of yet.  Today, he noticed heart palpitations.  He went to rest, which normally improves symptoms, but symptoms persisted and seemed more severe than normal.  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 Some mild tightness in the chest. No syncope. No SOB. Notes palpitations as the main symptoms.  He takes a daily aspirin but is not anticoagulated.  Past Medical History:  Diagnosis Date   Arthritis    Headache    occasional migraines treated with excedrin "a Crecencio Kwiatek time ago"   History of cardiac cath    a. cath in 10/2018 showing normal cors with preserved EF.    PONV (postoperative nausea and vomiting)    after knee surgeries    Patient Active Problem List   Diagnosis Date Noted   Right inguinal hernia    Hydrocele, right    Elevated troponin level not due to acute coronary syndrome 11/05/2018   Lumbar stenosis 08/24/2015    Past Surgical History:  Procedure Laterality Date   CARDIAC CATHETERIZATION  11/05/2018   INGUINAL HERNIA REPAIR Right 11/11/2020   Procedure: HERNIA REPAIR INGUINAL WITH MESH;  Surgeon: Aviva Signs, MD;  Location: [facility];  Service: General;  Laterality: Right;   KNEE SURGERY Bilateral    "bone spurr or cartilage operations", x6   LEFT HEART CATH AND CORONARY ANGIOGRAPHY N/A 11/05/2018   Procedure: LEFT  HEART CATH AND CORONARY ANGIOGRAPHY;  Surgeon: Leonie Man, MD;  Location: [facility] [facility];  Service: Cardiovascular;  Laterality: N/A;   LUMBAR LAMINECTOMY/DECOMPRESSION MICRODISCECTOMY N/A 08/24/2015   Procedure: L3-4 CENTRAL DECOMPRESSION ;  Surgeon: Marybelle Killings, MD;  Location: [place] [facility];  Service: Orthopedics;  Laterality: N/A;   RETINAL DETACHMENT SURGERY Right    RETINAL TEAR REPAIR CRYOTHERAPY Left     Allergies Atorvastatin  Family History  Problem Relation Age of Onset   Atrial fibrillation Mother    Coronary artery disease Father        CABG in his 26's    Social History Social History   Tobacco Use   Smoking status: Former    Types: Cigars    Quit date: 10/29/2018    Years since quitting: 2.1   Smokeless tobacco: Never  Vaping Use   Vaping Use: Never used  Substance Use Topics   Alcohol use: No   Drug use: No    Review of Systems  Constitutional: No fever/chills Eyes: No visual changes. ENT: No sore throat. Cardiovascular: Positive tightness and palpitations.  Respiratory: Denies shortness of breath. Gastrointestinal: No abdominal pain.  No nausea, no vomiting.  No diarrhea.  No constipation. Genitourinary: Negative for dysuria. Musculoskeletal: Negative for back pain. Skin: Negative for rash. Neurological: Negative for headaches, focal weakness or numbness.  10-point ROS otherwise negative.  ____________________________________________   PHYSICAL EXAM:  VITAL  SIGNS: ED Triage Vitals [12/15/20 1148]  Enc Vitals Group     BP 102/72     Pulse Rate (!) 119     Resp 17     Temp 98.5 F (36.9 C)     Temp Source Oral     SpO2 99 %   Constitutional: Alert and oriented. Well appearing and in no acute distress. Eyes: Conjunctivae are normal.  Head: Atraumatic. Nose: No congestion/rhinnorhea. Mouth/Throat: Mucous membranes are moist.   Neck: No stridor.  Cardiovascular: Irregular tachycardia. Good peripheral circulation. Grossly normal heart  sounds.   Respiratory: Normal respiratory effort.  No retractions. Lungs CTAB. Gastrointestinal: Soft and nontender. No distention.  Musculoskeletal: No lower extremity tenderness nor edema. No gross deformities of extremities. Neurologic:  Normal speech and language.  Skin:  Skin is warm, dry and intact. No rash noted.  ____________________________________________   LABS (all labs ordered are listed, but only abnormal results are displayed)  Labs Reviewed  COMPREHENSIVE METABOLIC PANEL - Abnormal; Notable for the following components:      Result Value   Glucose, Bld 123 (*)    All other components within normal limits  RESP PANEL BY RT-PCR (FLU A&B, COVID) ARPGX2  BRAIN NATRIURETIC PEPTIDE  CBC WITH DIFFERENTIAL/PLATELET  TSH  MAGNESIUM  TROPONIN I (HIGH SENSITIVITY)  TROPONIN I (HIGH SENSITIVITY)   ____________________________________________  EKG   EKG Interpretation  Date/Time:  Thursday December 15 2020 11:54:22 EDT Ventricular Rate:  145 PR Interval:    QRS Duration: 90 QT Interval:  304 QTC Calculation: 463 R Axis:   94 Text Interpretation: Atrial fibrillation Borderline right axis deviation Confirmed by Alona Bene [id number]) on 12/15/2020 12:04:52 PM        Repeat EKG:   EKG Interpretation  Date/Time:  Thursday December 15 2020 14:40:29 EDT Ventricular Rate:  70 PR Interval:  174 QRS Duration: 94 QT Interval:  399 QTC Calculation: 431 R Axis:   92 Text Interpretation: Sinus rhythm Borderline right axis deviation ST elevation suggests acute pericarditis Confirmed by Alona Bene [id number]) on 12/16/2020 7:11:33 AM        ____________________________________________  RADIOLOGY  DG Chest Portable 1 View  Result Date: 12/15/2020 CLINICAL DATA:  Palpitations.  Atrial fibrillation EXAM: PORTABLE CHEST 1 VIEW COMPARISON:  11/04/2018 FINDINGS: The heart size and mediastinal contours are within normal limits. Both lungs are clear. The visualized skeletal  structures are unremarkable. IMPRESSION: No active disease. Electronically Signed   By: Marlan Palau M.D.   On: 12/15/2020 12:33    ____________________________________________   PROCEDURES  Procedure(s) performed:   Procedures  CRITICAL CARE Performed by: Maia Plan Total critical care time: 35 minutes Critical care time was exclusive of separately billable procedures and treating other patients. Critical care was necessary to treat or prevent imminent or life-threatening deterioration. Critical care was time spent personally by me on the following activities: development of treatment plan with patient and/or surrogate as well as nursing, discussions with consultants, evaluation of patient's response to treatment, examination of patient, obtaining history from patient or surrogate, ordering and performing treatments and interventions, ordering and review of laboratory studies, ordering and review of radiographic studies, pulse oximetry and re-evaluation of patient's condition.  Alona Bene, MD Emergency Medicine  ____________________________________________   INITIAL IMPRESSION / ASSESSMENT AND PLAN / ED COURSE  Pertinent labs & imaging results that were available during my care of the patient were reviewed by me and considered in my medical decision making (see chart for details).  Patient presents emergency department with A. Fib.  Heart rate in the 1 20-1 40 range and symptomatic.  Has had intermittent symptoms for the past year.  He is not anticoagulated.  Does not meet criteria for ED cardioversion in the setting.  No contraindication to anticoagulation.  Plan to start heparin along with diltiazem.   Labs are WNL including troponin and electrolytes. TSH WNL. Responding well to diltiazem. Continue to monitor.   02:35 PM  Patient has converted to normal sinus rhythm.  Will discontinue diltiazem infusion along with heparin.  Patient is feeling well.  Will monitor briefly but  likely discharge home with anticoagulation, low-dose diltiazem daily, cardiology follow-up/referral.    CHA2DS2-VASc Score = 1  This indicates a 0.6 % annual risk of stroke.   ____________________________________________  FINAL CLINICAL IMPRESSION(S) / ED DIAGNOSES  Final diagnoses:  Atrial fibrillation with RVR (HCC)     MEDICATIONS GIVEN DURING THIS VISIT:  Medications  sodium chloride 0.9 % bolus 500 mL (0 mLs Intravenous Stopped 12/15/20 1353)  heparin bolus via infusion 4,000 Units (4,000 Units Intravenous Bolus from Bag 12/15/20 1308)     NEW OUTPATIENT MEDICATIONS STARTED DURING THIS VISIT:  Discharge Medication List as of 12/15/2020  3:37 PM     START taking these medications   Details  apixaban (ELIQUIS) 5 MG TABS tablet Take 1 tablet (5 mg total) by mouth 2 (two) times daily., Starting Thu 12/15/2020, Until Sat 01/14/2021, Normal    diltiazem (CARDIZEM) 30 MG tablet Take 1 tablet (30 mg total) by mouth 2 (two) times daily., Starting Thu 12/15/2020, Until Sat 01/14/2021, Normal        Note:  This document was prepared using Dragon voice recognition software and may include unintentional dictation errors.  Alona Bene, MD, [facility] Emergency Medicine    Njeri Vicente, Arlyss Repress, MD 12/16/20 [phone]

## 2020-12-22 ENCOUNTER — Ambulatory Visit (HOSPITAL_COMMUNITY)
Admission: RE | Admit: 2020-12-22 | Discharge: 2020-12-22 | Disposition: A | Discharge status: Home / Self Care | Payer: BC Managed Care – PPO | Source: Ambulatory Visit | Attending: Physician Assistant | Admitting: Physician Assistant

## 2020-12-22 ENCOUNTER — Encounter (HOSPITAL_COMMUNITY): Payer: Self-pay | Admitting: Physician Assistant

## 2020-12-22 VITALS — BP 126/68 | HR 80 | Ht 77.01 in | Wt 185.6 lb

## 2020-12-22 DIAGNOSIS — Z87891 Personal history of nicotine dependence: Secondary | ICD-10-CM | POA: Diagnosis not present

## 2020-12-22 DIAGNOSIS — I480 Paroxysmal atrial fibrillation: Secondary | ICD-10-CM | POA: Diagnosis not present

## 2020-12-22 MED ORDER — DILTIAZEM HCL ER COATED BEADS 120 MG PO CP24: 120.0000 mg | ORAL_CAPSULE | Freq: Every day | ORAL | 3 refills | Status: DC

## 2020-12-22 MED ORDER — APIXABAN 5 MG PO TABS: 5.0000 mg | ORAL_TABLET | Freq: Two times a day (BID) | ORAL | 3 refills | Status: DC

## 2020-12-22 MED ORDER — DILTIAZEM HCL 30 MG PO TABS: ORAL_TABLET | ORAL | 0 refills | Status: DC

## 2020-12-22 NOTE — Progress Notes (Signed)
Primary Care Physician: Pcp, No Primary Cardiologist: Dr Domenic Polite Primary Electrophysiologist: none Referring Physician: Dr Chevis Pretty is a 61 y.o. male with a history of tobacco use and atrial fibrillation who presents for consultation in the [facility]. He most presented to [facility] ED on 11/05/2018 for evaluation of chest pain. 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  EKG showed no acute ischemic changes but high-sensitivity troponin values were found to be elevated to 712. LHC showed angiographically normal coronary arteries. Echo showed preserved EF of 60 to 65% with no regional wall motion abnormalities and no evidence of a pericardial effusion. It was thought that his symptoms might be secondary to vasospasm or myocarditis. The patient was initially diagnosed with atrial fibrillation 12/15/20 after presenting to the ED with symptoms of palpitations and some mild chest tightness. ECG showed afib with RVR. He has had intermittent  palpitations for the past year. He was started on a diltiazem drip which converted him to SR. He was started on Eliquis for a CHADS2VASC score of 0. He has palpitations about once per week.   Today, he denies symptoms of chest pain, shortness of breath, orthopnea, PND, lower extremity edema, dizziness, presyncope, syncope, snoring, daytime somnolence, bleeding, or neurologic sequela. The patient is tolerating medications without difficulties and is otherwise without complaint today.    Atrial Fibrillation Risk Factors:  he does not have symptoms or diagnosis of sleep apnea. he does not have a history of rheumatic fever. he does not have a history of alcohol use. The patient does have a history of early familial atrial fibrillation or other arrhythmias. Mother has afib.  he has a BMI of Body mass index is 22 kg/m.Marland Kitchen Filed Weights   12/22/20  1524  Weight: 84.2 kg    Family History  Problem Relation Age of Onset   Atrial fibrillation Mother    Coronary artery disease Father        CABG in his 37's     Atrial Fibrillation Management history:  Previous antiarrhythmic drugs: none Previous cardioversions: none Previous ablations: none CHADS2VASC score: 0 Anticoagulation history: Eliquis   Past Medical History:  Diagnosis Date   Arthritis    Headache    occasional migraines treated with excedrin "a long time ago"   History of cardiac cath    a. cath in 10/2018 showing normal cors with preserved EF.    PONV (postoperative nausea and vomiting)    after knee surgeries   Past Surgical History:  Procedure Laterality Date   CARDIAC CATHETERIZATION  11/05/2018   INGUINAL HERNIA REPAIR Right 11/11/2020   Procedure: HERNIA REPAIR INGUINAL WITH MESH;  Surgeon: Aviva Signs, MD;  Location: [facility];  Service: General;  Laterality: Right;   KNEE SURGERY Bilateral    "bone spurr or cartilage operations", x6   LEFT HEART CATH AND CORONARY ANGIOGRAPHY N/A 11/05/2018   Procedure: LEFT HEART CATH AND CORONARY ANGIOGRAPHY;  Surgeon: Leonie Man, MD;  Location: [place] [facility];  Service: Cardiovascular;  Laterality: N/A;   LUMBAR LAMINECTOMY/DECOMPRESSION MICRODISCECTOMY N/A 08/24/2015   Procedure: L3-4 CENTRAL DECOMPRESSION ;  Surgeon: Marybelle Killings, MD;  Location: [place];  Service: Orthopedics;  Laterality: N/A;   RETINAL DETACHMENT SURGERY Right    RETINAL TEAR REPAIR CRYOTHERAPY Left     Current Outpatient Medications  Medication Sig Dispense Refill   diltiazem (CARDIZEM CD) 120 MG  24 hr capsule Take 1 capsule (120 mg total) by mouth daily. 30 capsule 3   tadalafil (CIALIS) 20 MG tablet SMARTSIG:0.5-1 Tablet(s) By Mouth PRN     apixaban (ELIQUIS) 5 MG TABS tablet Take 1 tablet (5 mg total) by mouth 2 (two) times daily. 60 tablet 3   diltiazem (CARDIZEM) 30 MG tablet take 1 tablet every 4 hours AS NEEDED for heart rate  >100 60 tablet 0   No current facility-administered medications for this encounter.    Allergies  Allergen Reactions   Atorvastatin Rash    Social History   Socioeconomic History   Marital status: Married    Spouse name: Not on file   Number of children: Not on file   Years of education: Not on file   Highest education level: Not on file  Occupational History   Not on file  Tobacco Use   Smoking status: Former    Types: Cigars    Quit date: 10/29/2018    Years since quitting: 2.1   Smokeless tobacco: Never   Tobacco comments:    Former cigar smoker 12/22/2020  Vaping Use   Vaping Use: Never used  Substance and Sexual Activity   Alcohol use: No   Drug use: Yes    Types: Marijuana    Comment: daily   Sexual activity: Yes  Other Topics Concern   Not on file  Social History Narrative   Not on file   Social Determinants of Health   Financial Resource Strain: Not on file  Food Insecurity: Not on file  Transportation Needs: Not on file  Physical Activity: Not on file  Stress: Not on file  Social Connections: Not on file  Intimate Partner Violence: Not on file     ROS- All systems are reviewed and negative except as per the HPI above.  Physical Exam: Vitals:   12/22/20 1524  BP: 126/68  Pulse: 80  Weight: 84.2 kg  Height: 6' 5.01" (1.956 m)    GEN- The patient is a well appearing male, alert and oriented x 3 today.   Head- normocephalic, atraumatic Eyes-  Sclera clear, conjunctiva pink Ears- hearing intact Oropharynx- clear Neck- supple  Lungs- Clear to ausculation bilaterally, normal work of breathing Heart- Regular rate and rhythm, no murmurs, rubs or gallops  GI- soft, NT, ND, + BS Extremities- no clubbing, cyanosis, or edema MS- no significant deformity or atrophy Skin- no rash or lesion Psych- euthymic mood, full affect Neuro- strength and sensation are intact  Wt Readings from Last 3 Encounters:  12/22/20 84.2 kg  12/15/20 83.9 kg   11/10/20 83.9 kg    EKG today demonstrates  SR Vent. rate 80 BPM PR interval 174 ms QRS duration 94 ms QT/QTcB 370/426 ms  Echo 11/05/18 demonstrated   1. Left ventricular ejection fraction, by visual estimation, is 60 to  65%. The left ventricle has normal function. Normal left ventricular size. There is no left ventricular hypertrophy.   2. Left ventricular diastolic Doppler parameters are indeterminate  pattern of LV diastolic filling.   3. Global right ventricle has normal systolic function.The right  ventricular size is normal. No increase in right ventricular wall  thickness.   4. Left atrial size was normal.   5. Right atrial size was normal.   6. The mitral valve is normal in structure. Trace mitral valve  regurgitation. No evidence of mitral stenosis.   7. The tricuspid valve is normal in structure. Tricuspid valve  regurgitation was not visualized  by color flow Doppler.   8. The aortic valve is normal in structure. Aortic valve regurgitation  was not visualized by color flow Doppler. Mild aortic valve sclerosis  without stenosis.   9. The pulmonic valve was normal in structure. Pulmonic valve  regurgitation is not visualized by color flow Doppler.  10. The inferior vena cava is dilated in size with <50% respiratory  variability, suggesting right atrial pressure of 15 mmHg.   Epic records are reviewed at length today  CHA2DS2-VASc Score = 0  The patient's score is based upon: CHF History: 0 HTN History: 0 Diabetes History: 0 Stroke History: 0 Vascular Disease History: 0 Age Score: 0 Gender Score: 0      ASSESSMENT AND PLAN: 1. Paroxysmal Atrial Fibrillation (ICD10:  I48.0) The patient's CHA2DS2-VASc score is 0, indicating a 0.2% annual risk of stroke.   General education about afib provided and questions answered. We also discussed his stroke risk and the risks and benefits of anticoagulation. Will continue Eliquis 5 mg BID for now. We discussed rhythm  control options including AAD and ablation. He would like to avoid medications if possible and would like to speak to EP about ablation.  Consolidate diltiazem dose to 120 mg daily with 30 mg PRN q 4 hours for heart racing.     Follow up with EP for evaluation.    [facility] [address] [place], [place] [address] [phone] 12/22/2020 4:03 PM

## 2020-12-22 NOTE — Patient Instructions (Signed)
Stop cardizem 30mg  daily -- use only as needed for elevated heart rates over 100  Start Cardizem 120mg  once a day

## 2021-01-03 ENCOUNTER — Encounter (HOSPITAL_COMMUNITY): Payer: Self-pay | Admitting: Physician Assistant

## 2021-01-10 IMAGING — MR MR LUMBAR SPINE WO/W CM
4 of 7 series · 12 of 48 positions shown · IV contrast (9 ml Gadavist)
Comparison: Radiographs dated 01/20/2019 and lumbar MRI dated
08/04/2015

CLINICAL DATA: Chronic progressive low back pain and left lower
extremity pain.

EXAM:
MRI LUMBAR SPINE WITHOUT AND WITH CONTRAST
TECHNIQUE: Multiplanar and multiecho pulse sequences of the lumbar spine were
obtained without and with intravenous contrast.
CONTRAST:  9mL GADAVIST GADOBUTROL 1 MMOL/ML IV SOLN

[Series 3: T2 · sagittal · 4.0mm · 0.43mm/px · 3 of 19 slices shown (1 of 2)]
[im 1/19]
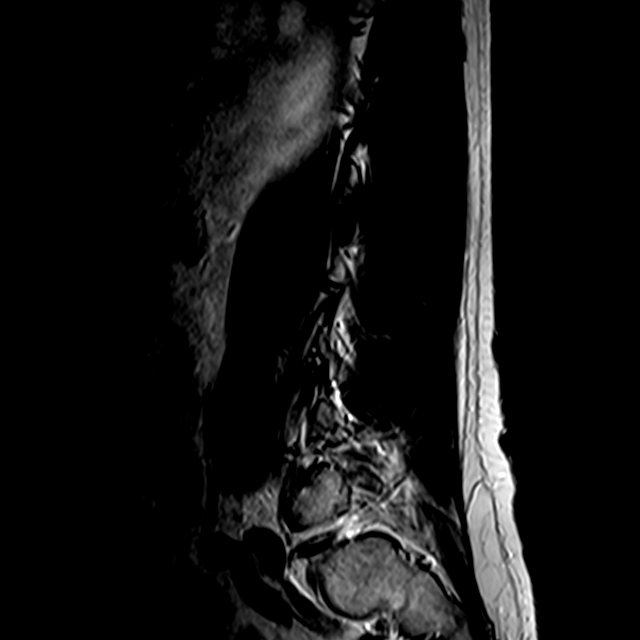
[im 10/19]
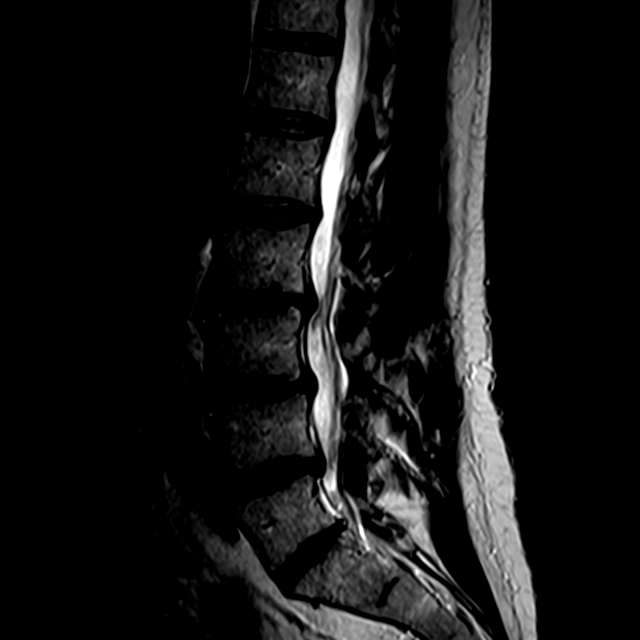
[im 19/19]
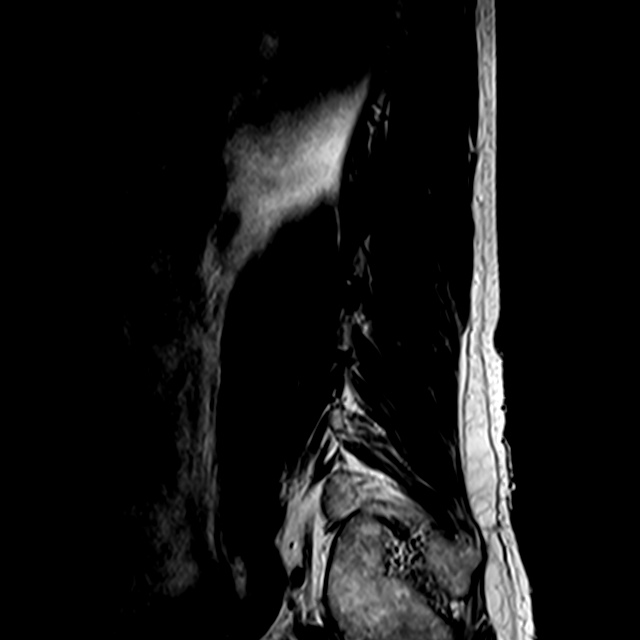

[Series 4: T1 · sagittal · 4.0mm · 0.43mm/px · 3 of 19 slices shown (1 of 2)]
[im 1/19]
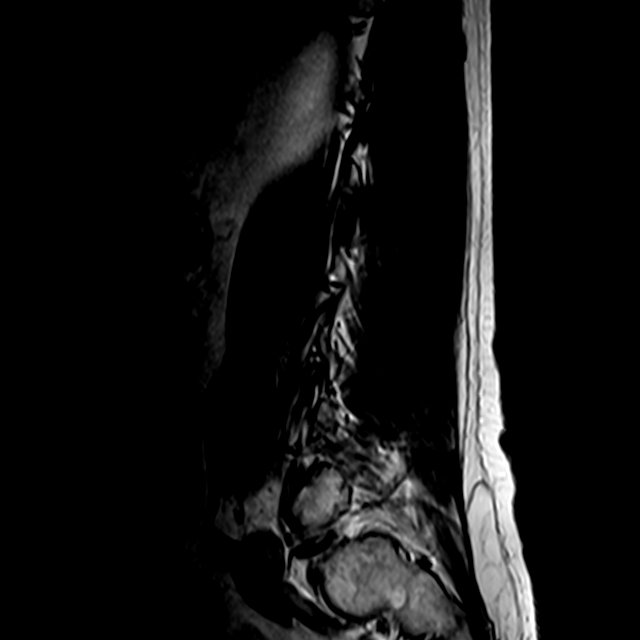
[im 10/19]
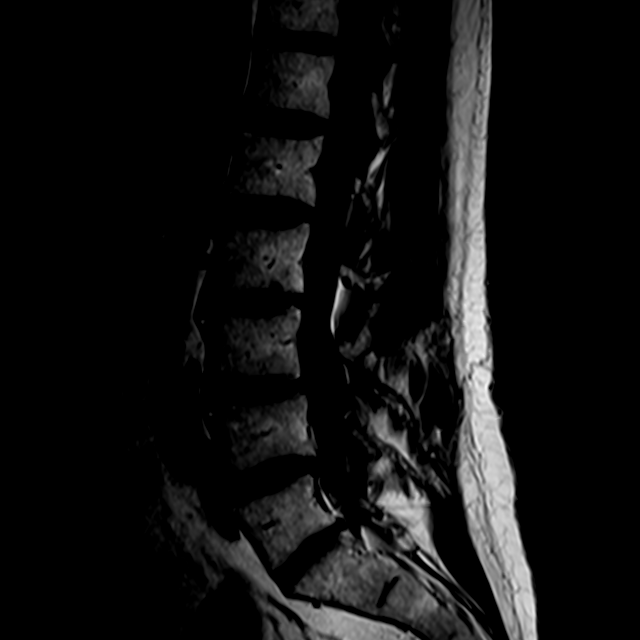
[im 19/19]
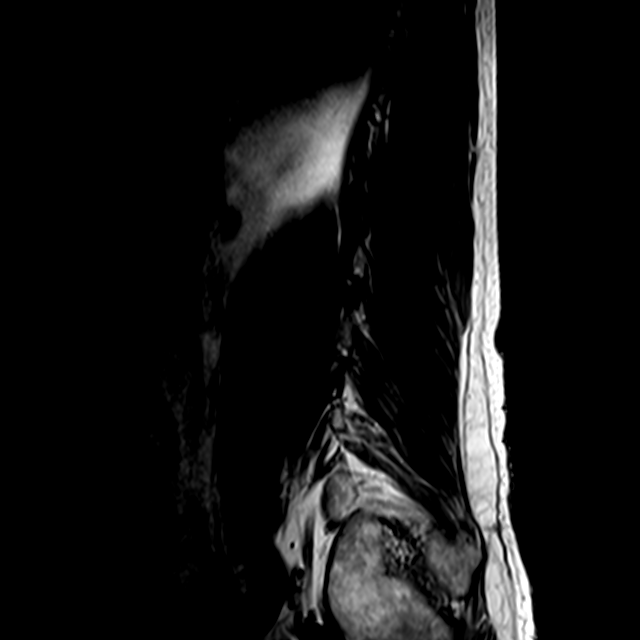

[Series 6: T1 · axial · 4.0mm · 0.27mm/px · z∈[-142,+24]mm · 3 of 46 slices shown (2 of 2)]
[im 6/46]
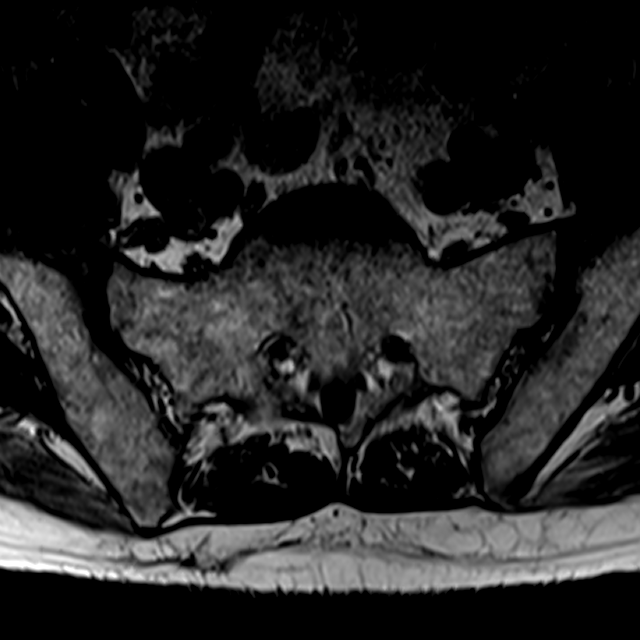
[im 26/46]
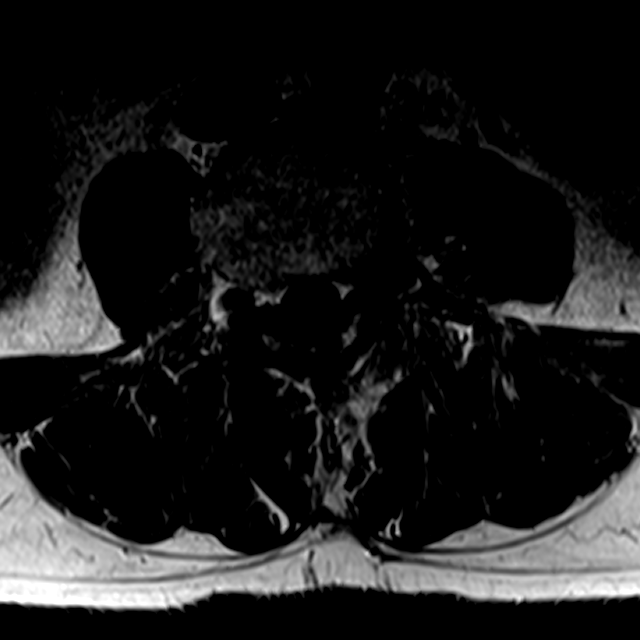
[im 41/46]
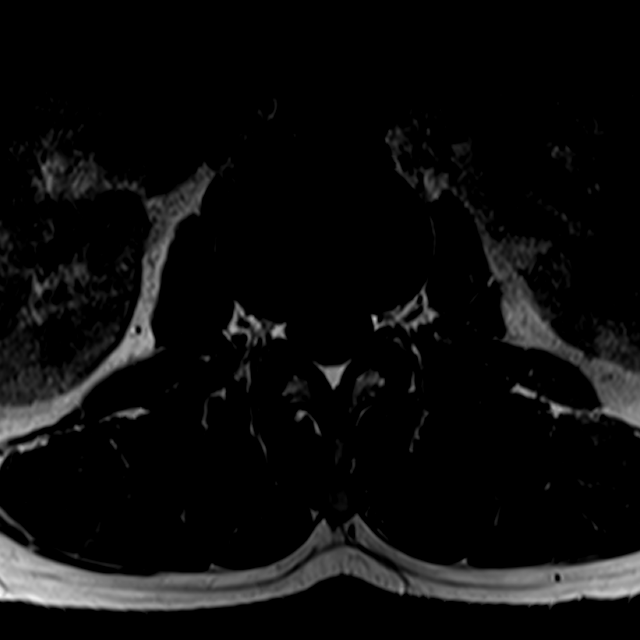

[Series 7: T2 · axial · 4.0mm · 0.27mm/px · z∈[-142,+24]mm · 3 of 46 slices shown (2 of 2)]
[im 6/46]
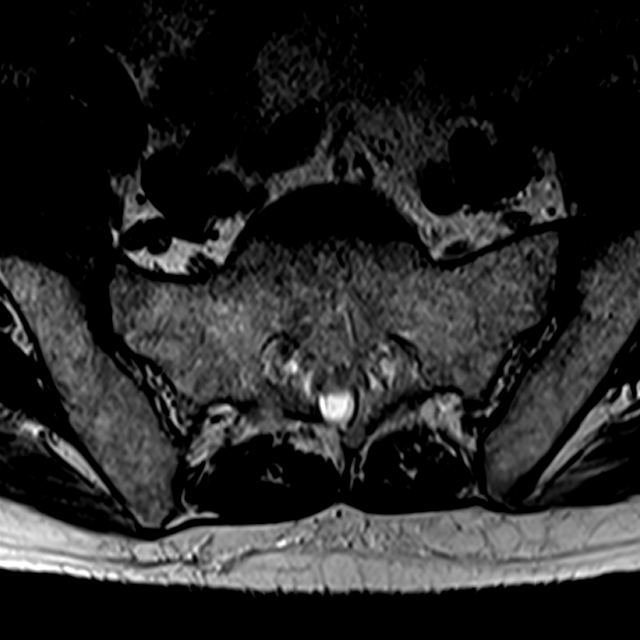
[im 26/46]
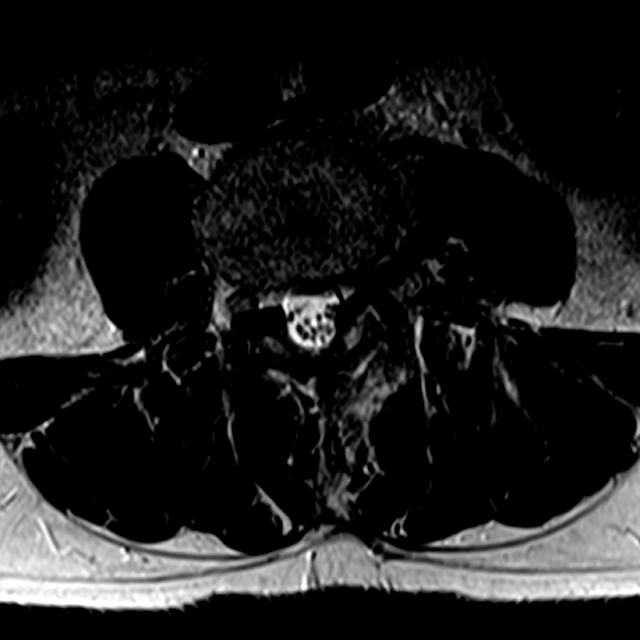
[im 41/46]
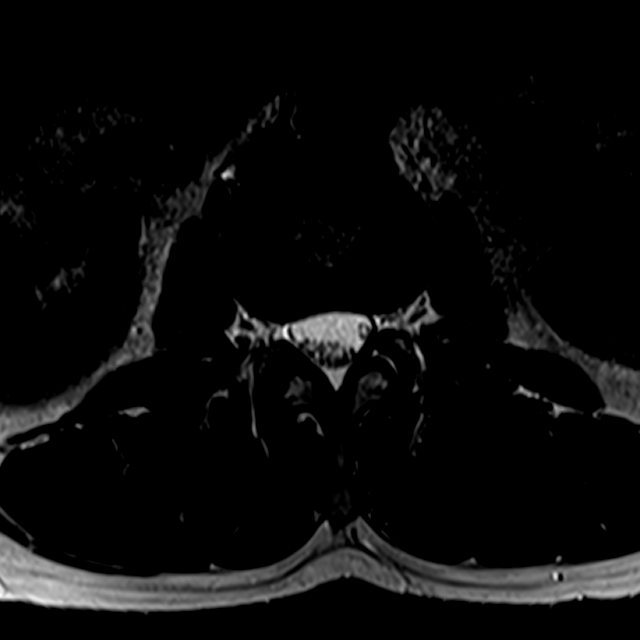

[12 of 48 positions shown; findings below may reference images not displayed]

FINDINGS: Segmentation:  Transitional L5 vertebra.

Alignment: 2 mm anterolisthesis of L3 on L4. 2 mm retrolisthesis of
L4 on L5.

Vertebrae:  No fracture, evidence of discitis, or bone lesion.

Conus medullaris and cauda equina: Conus extends to the T12 level.
Conus and cauda equina appear normal.

Paraspinal and other soft tissues: Negative.

Disc levels:

L1-2: Tiny disc bulge to the right of midline with no neural
impingement, new since the prior study.

L2-3: Disc desiccation with disc space narrowing. Small broad-based
disc protrusion with accompanying osteophytes slightly increased
since the prior study. Symmetrical compression of the thecal sac
without focal neural impingement. Slight spinal stenosis.

L3-4: Disc desiccation with disc space narrowing. Small broad-based
soft disc protrusion asymmetric to the left extending into the left
neural foramen. Interval posterior decompression with relief of the
spinal stenosis. Hypertrophy of the left ligamentum flavum and facet
joint does encroach upon the left lateral recess best seen on images
23 and 24 of series 7 which could affect the left L4 nerve. The
protrusion extends into the left neural foramen and is immediately
adjacent to the exiting left L3 nerve but the nerve appears to exit
without impingement. Moderate bilateral facet arthritis. There is
enhancement around the left facet joint at L3-4 after contrast
administration. Minimal enhancing scarring at the operative level to
the expected degree.

L4-5: Slight retrolisthesis, new since the prior study with a
broad-based disc bulge with a small central subligamentous disc
protrusion with a small extrusion extending inferiorly to the left
of midline behind the body of L5 seen on image 13 of series 4. This
does not appear to compress the nerve roots. Slight bilateral facet
arthritis with ligamentum flavum hypertrophy. Moderate bilateral
foraminal stenosis, unchanged.

L5-S1: Disc space narrowing. Tiny disc bulge to the right of midline
with an annular fissure. No neural impingement.
IMPRESSION: 1. Interval posterior decompression at L3-4 with relief of the
spinal stenosis.
2. Left lateral recess and left foraminal impingement at L3-4 which
could affect the left L4 nerve.
3. Moderate bilateral foraminal stenosis at L4-5, unchanged.

## 2021-05-23 ENCOUNTER — Encounter: Payer: Self-pay | Admitting: Orthopaedic Surgery

## 2021-05-23 ENCOUNTER — Ambulatory Visit: Payer: BC Managed Care – PPO | Admitting: Orthopaedic Surgery

## 2021-05-23 VITALS — BP 126/90 | HR 98 | Ht 77.0 in | Wt 184.0 lb

## 2021-05-23 DIAGNOSIS — M65331 Trigger finger, right middle finger: Secondary | ICD-10-CM

## 2021-05-23 DIAGNOSIS — M65341 Trigger finger, right ring finger: Secondary | ICD-10-CM | POA: Diagnosis not present

## 2021-05-23 NOTE — Progress Notes (Signed)
? ?Subjective:  ? ? Patient ID: Craig [name], male    DOB: Nov 10, 1960, 61 y.o.   MRN: [id number] ? ?HPI ?He has trigger fingers of the right ring, right long and left long fingers.  The right ring finger is much more painful and locks often.  He would like something done for the right ring finger.  ? ?I have explained that definitive treatment of trigger fingers is outpatient surgery but that I can inject today and give temporary relief.  He is a Runner, broadcasting/film/videoteacher (math, seventh grade) and would like to hold off any surgery for now. ? ?He has no numbness, no redness, no trauma. ? ? ?Review of Systems  ?Constitutional:  Positive for activity change.  ?Musculoskeletal:  Positive for arthralgias and myalgias.  ?All other systems reviewed and are negative. ?For Review of Systems, all other systems reviewed and are negative. ? ?The following is a summary of the past history medically, past history surgically, known current medicines, social history and family history.  This information is gathered electronically by the computer from prior information and documentation.  I review this each visit and have found including this information at this point in the chart is beneficial and informative.  ? ?Past Medical History:  ?Diagnosis Date  ? Arthritis   ? Headache   ? occasional migraines treated with excedrin "a long time ago"  ? History of cardiac cath   ? a. cath in 10/2018 showing normal cors with preserved EF.   ? PONV (postoperative nausea and vomiting)   ? after knee surgeries  ? ? ?Past Surgical History:  ?Procedure Laterality Date  ? CARDIAC CATHETERIZATION  11/05/2018  ? INGUINAL HERNIA REPAIR Right 11/11/2020  ? Procedure: HERNIA REPAIR INGUINAL WITH MESH;  Surgeon: Franky MachoJenkins, Mark, MD;  Location: [facility];  Service: General;  Laterality: Right;  ? KNEE SURGERY Bilateral   ? "bone spurr or cartilage operations", x6  ? LEFT HEART CATH AND CORONARY ANGIOGRAPHY N/A 11/05/2018  ? Procedure: LEFT HEART CATH AND CORONARY  ANGIOGRAPHY;  Surgeon: Marykay LexHarding, David W, MD;  [facility];  Service: Cardiovascular;  Laterality: N/A;  ? LUMBAR LAMINECTOMY/DECOMPRESSION MICRODISCECTOMY N/A 08/24/2015  ? Procedure: L3-4 CENTRAL DECOMPRESSION ;  Surgeon: Eldred MangesMark C Yates, MD;  [facility];  Service: Orthopedics;  Laterality: N/A;  ? RETINAL DETACHMENT SURGERY Right   ? RETINAL TEAR REPAIR CRYOTHERAPY Left   ? ? ?Current Outpatient Medications on File Prior to Visit  ?Medication Sig Dispense Refill  ? diltiazem (CARDIZEM CD) 120 MG 24 hr capsule Take 1 capsule (120 mg total) by mouth daily. 30 capsule 3  ? diltiazem (CARDIZEM) 30 MG tablet take 1 tablet every 4 hours AS NEEDED for heart rate >100 60 tablet 0  ? tadalafil (CIALIS) 20 MG tablet SMARTSIG:0.5-1 Tablet(s) By Mouth PRN    ? apixaban (ELIQUIS) 5 MG TABS tablet Take 1 tablet (5 mg total) by mouth 2 (two) times daily. 60 tablet 3  ? ?No current facility-administered medications on file prior to visit.  ? ? ?Social History  ? ?Socioeconomic History  ? Marital status: Married  ?  Spouse name: Not on file  ? Number of children: Not on file  ? Years of education: Not on file  ? Highest education level: Not on file  ?Occupational History  ? Not on file  ?Tobacco Use  ? Smoking status: Former  ?  Types: Cigars  ?  Quit date: 10/29/2018  ?  Years since quitting: 2.5  ?  Smokeless tobacco: Never  ? Tobacco comments:  ?  Former cigar smoker 12/22/2020  ?Vaping Use  ? Vaping Use: Never used  ?Substance and Sexual Activity  ? Alcohol use: No  ? Drug use: Yes  ?  Types: Marijuana  ?  Comment: daily  ? Sexual activity: Yes  ?Other Topics Concern  ? Not on file  ?Social History Narrative  ? Not on file  ? ?Social Determinants of Health  ? ?Financial Resource Strain: Not on file  ?Food Insecurity: Not on file  ?Transportation Needs: Not on file  ?Physical Activity: Not on file  ?Stress: Not on file  ?Social Connections: Not on file  ?Intimate Partner Violence: Not on file  ? ? ?Family  History  ?Problem Relation Age of Onset  ? Atrial fibrillation Mother   ? Coronary artery disease Father   ?     CABG in his 70's  ? ? ?BP 126/90   Pulse 98   Ht 6\' 5"  (1.956 m)   Wt 184 lb (83.5 kg)   BMI 21.82 kg/m?  ? ?Body mass index is 21.82 kg/m?. ? ?   ?Objective:  ? Physical Exam ?Vitals and nursing note reviewed. Exam conducted with a chaperone present.  ?Constitutional:   ?   Appearance: He is well-developed.  ?HENT:  ?   Head: Normocephalic and atraumatic.  ?Eyes:  ?   Conjunctiva/sclera: Conjunctivae normal.  ?   Pupils: Pupils are equal, round, and reactive to light.  ?Cardiovascular:  ?   Rate and Rhythm: Normal rate and regular rhythm.  ?Pulmonary:  ?   Effort: Pulmonary effort is normal.  ?Abdominal:  ?   Palpations: Abdomen is soft.  ?Musculoskeletal:  ?     Hands: ? ?   Cervical back: Normal range of motion and neck supple.  ?Skin: ?   General: Skin is warm and dry.  ?Neurological:  ?   Mental Status: He is alert and oriented to person, place, and time.  ?   Cranial Nerves: No cranial nerve deficit.  ?   Motor: No abnormal muscle tone.  ?   Coordination: Coordination normal.  ?   Deep Tendon Reflexes: Reflexes are normal and symmetric. Reflexes normal.  ?Psychiatric:     ?   Behavior: Behavior normal.     ?   Thought Content: Thought content normal.     ?   Judgment: Judgment normal.  ? ? ? ? ? ?   ?Assessment & Plan:  ? ?Encounter Diagnoses  ?Name Primary?  ? Trigger finger, right ring finger Yes  ? Trigger finger, right middle finger   ? ?PROCEDURE ? ?Trigger Finger Injection ? ?The right Middle finger  and Ring finger  has been locking at the A1 pulley.  The patient has been told about injection of the digit.  Surgical correction and excision of the A1 pulley will resolve the problem.  Ani injection in the digit should help but the results may be short lived.  The patient asked appropriate questions and understands the procedure.  The patient has elected for an injection at this time.   Verbal consent was obtained.  A timeout was taken to confirm the proper hand and digit. ? ?Medication for each finger ? 1 mL of DepoMedrol 40 ? 2 mL of 1% lidocaine plain ? ?Ethyl chloride for anesthesia ? ?Alcohol was used to prepare the skin along with ethyl chloride and then the injection was made at the A1 pulley for both fingers  there were no complications.  It was tolerated well. ? ?A Band-aid dressing was applied. ? ?Call if any problem or difficulty.  ? ?Return in two weeks. ? ?Call if any problem. ? ?Precautions discussed. ? ?Electronically Signed ?Darreld Mclean, MD ?4/11/20238:57 AM ? ?

## 2021-06-06 ENCOUNTER — Ambulatory Visit: Payer: BC Managed Care – PPO | Admitting: Orthopaedic Surgery

## 2021-06-27 ENCOUNTER — Encounter (HOSPITAL_COMMUNITY): Payer: Self-pay

## 2021-06-27 ENCOUNTER — Emergency Department (HOSPITAL_COMMUNITY): Payer: BC Managed Care – PPO

## 2021-06-27 ENCOUNTER — Other Ambulatory Visit: Payer: Self-pay

## 2021-06-27 ENCOUNTER — Emergency Department (HOSPITAL_COMMUNITY)
Admission: EM | Admit: 2021-06-27 | Discharge: 2021-06-27 | Disposition: A | Discharge status: Home / Self Care | Payer: BC Managed Care – PPO | Attending: Emergency Medicine | Admitting: Emergency Medicine

## 2021-06-27 DIAGNOSIS — R519 Headache, unspecified: Secondary | ICD-10-CM | POA: Diagnosis not present

## 2021-06-27 DIAGNOSIS — I480 Paroxysmal atrial fibrillation: Secondary | ICD-10-CM | POA: Insufficient documentation

## 2021-06-27 DIAGNOSIS — R079 Chest pain, unspecified: Secondary | ICD-10-CM | POA: Diagnosis present

## 2021-06-27 DIAGNOSIS — Z7901 Long term (current) use of anticoagulants: Secondary | ICD-10-CM | POA: Diagnosis not present

## 2021-06-27 LAB — COMPREHENSIVE METABOLIC PANEL
ALT: 15 U/L (ref 0–44)
AST: 17 U/L (ref 15–41)
Albumin: 4.2 g/dL (ref 3.5–5.0)
Alkaline Phosphatase: 70 U/L (ref 38–126)
Anion gap: 4 — ABNORMAL LOW (ref 5–15)
BUN: 16 mg/dL (ref 6–20)
CO2: 26 mmol/L (ref 22–32)
Calcium: 8.9 mg/dL (ref 8.9–10.3)
Chloride: 109 mmol/L (ref 98–111)
Creatinine, Ser: 1.02 mg/dL (ref 0.61–1.24)
GFR, Estimated: >60 mL/min (ref >60)
Glucose, Bld: 92 mg/dL (ref 70–99)
Potassium: 4.1 mmol/L (ref 3.5–5.1)
Sodium: 139 mmol/L (ref 135–145)
Total Bilirubin: 0.4 mg/dL (ref 0.3–1.2)
Total Protein: 7.1 g/dL (ref 6.5–8.1)

## 2021-06-27 LAB — CBC WITH DIFFERENTIAL/PLATELET
Abs Immature Granulocytes: 0.03 10*3/uL (ref 0.00–0.07)
Basophils Absolute: 0.1 10*3/uL (ref 0.0–0.1)
Basophils Relative: 1 %
Eosinophils Absolute: 0.2 10*3/uL (ref 0.0–0.5)
Eosinophils Relative: 2 %
HCT: 38.6 % — ABNORMAL LOW (ref 39.0–52.0)
Hemoglobin: 13 g/dL (ref 13.0–17.0)
Immature Granulocytes: 0 %
Lymphocytes Relative: 26 %
Lymphs Abs: 2.2 10*3/uL (ref 0.7–4.0)
MCH: 30.1 pg (ref 26.0–34.0)
MCHC: 33.7 g/dL (ref 30.0–36.0)
MCV: 89.4 fL (ref 80.0–100.0)
Monocytes Absolute: 0.6 10*3/uL (ref 0.1–1.0)
Monocytes Relative: 7 %
Neutro Abs: 5.5 10*3/uL (ref 1.7–7.7)
Neutrophils Relative %: 64 %
Platelets: 227 10*3/uL (ref 150–400)
RBC: 4.32 MIL/uL (ref 4.22–5.81)
RDW: 14.7 % (ref 11.5–15.5)
WBC: 8.6 10*3/uL (ref 4.0–10.5)
nRBC: 0 % (ref 0.0–0.2)

## 2021-06-27 LAB — PROTIME-INR
INR: 0.9 (ref 0.8–1.2)
Prothrombin Time: 12.3 seconds (ref 11.4–15.2)

## 2021-06-27 LAB — MAGNESIUM: Magnesium: 2.3 mg/dL (ref 1.7–2.4)

## 2021-06-27 LAB — TROPONIN I (HIGH SENSITIVITY): Troponin I (High Sensitivity): 4 ng/L (ref <18)

## 2021-06-27 MED ORDER — ASPIRIN 81 MG PO CHEW
324.0000 mg | CHEWABLE_TABLET | Freq: Once | ORAL | Status: AC
  Administered 2021-06-27: 324 mg via ORAL
  Filled 2021-06-27: qty 4

## 2021-06-27 MED ORDER — DILTIAZEM HCL ER COATED BEADS 120 MG PO CP24: 120.0000 mg | ORAL_CAPSULE | Freq: Every day | ORAL | Status: DC

## 2021-06-27 MED ORDER — DILTIAZEM HCL ER COATED BEADS 120 MG PO CP24: 120.0000 mg | ORAL_CAPSULE | Freq: Every day | ORAL | 3 refills | Status: DC

## 2021-06-27 MED ORDER — APIXABAN 5 MG PO TABS: 5.0000 mg | ORAL_TABLET | Freq: Two times a day (BID) | ORAL | 3 refills | Status: DC

## 2021-06-27 MED ORDER — DILTIAZEM HCL-DEXTROSE 125-5 MG/125ML-% IV SOLN (PREMIX): 5.0000 mg/h | INTRAVENOUS | Status: DC

## 2021-06-27 MED ORDER — DILTIAZEM HCL 25 MG/5ML IV SOLN
10.0000 mg | Freq: Once | INTRAVENOUS | Status: AC
  Administered 2021-06-27: 10 mg via INTRAVENOUS
  Filled 2021-06-27: qty 5

## 2021-06-27 MED ORDER — DILTIAZEM LOAD VIA INFUSION
10.0000 mg | Freq: Once | INTRAVENOUS | Status: DC
  Filled 2021-06-27: qty 10

## 2021-06-27 MED ORDER — DILTIAZEM HCL ER COATED BEADS 120 MG PO CP24
120.0000 mg | ORAL_CAPSULE | Freq: Once | ORAL | Status: AC
  Administered 2021-06-27: 120 mg via ORAL
  Filled 2021-06-27: qty 1

## 2021-06-27 MED ORDER — APIXABAN 5 MG PO TABS
5.0000 mg | ORAL_TABLET | Freq: Once | ORAL | Status: AC
  Administered 2021-06-27: 5 mg via ORAL
  Filled 2021-06-27: qty 1

## 2021-06-27 MED ORDER — DILTIAZEM LOAD VIA INFUSION: 15.0000 mg | Freq: Once | INTRAVENOUS | Status: DC

## 2021-06-27 MED ORDER — DILTIAZEM HCL 30 MG PO TABS: ORAL_TABLET | ORAL | 0 refills | Status: DC

## 2021-06-27 NOTE — ED Triage Notes (Signed)
Patient reports chest tightness with headache that started shortly before arrival. Concerned that he may be in A-fib RVR. Similar symptoms in the past.  ?

## 2021-06-27 NOTE — Discharge Instructions (Signed)
Resume taking her Eliquis and Cardizem.  New prescriptions were sent to [facility].  Follow-up with cardiology.  Return to the emergency department at any time for any worsening symptoms. ?

## 2021-06-27 NOTE — ED Provider Notes (Signed)
[facility] Provider Note   CSN: [id number] Arrival date & time: 06/27/21  1431     History  Chief Complaint  Patient presents with   Chest Pain    Craig [name] is a 61 y.o. male.   Chest Pain Pain quality: tightness   Pain radiates to:  Does not radiate Pain severity:  Mild Onset quality:  Sudden Duration:  3 hours Timing:  Constant Progression:  Unchanged Chronicity:  Recurrent Worsened by:  Nothing Ineffective treatments:  None tried Associated symptoms: headache and palpitations   Patient presents for chest tightness.  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  Medical history includes paroxysmal atrial fibrillation, ACS, lumbar stenosis.  He had a clean heart cath in 2020.  He states that his current symptoms are reminiscent of previous episodes of atrial fibrillation.  This was a relatively recent diagnosis for him.  He was initially prescribed Eliquis and Cardizem.  He states that he never started taking his Cardizem.  He was taking his Eliquis but has not taken it in the last several days.  He otherwise has good underlying health and does not take daily medications.  He states that he continues to feel his chest tightness.  He describes some mild palpitations.  He also endorses a mild headache.    Home Medications Prior to Admission medications   Medication Sig Start Date End Date Taking? Authorizing Provider  tadalafil (CIALIS) 20 MG tablet Take 20 mg by mouth daily as needed for erectile dysfunction. 12/07/20  Yes [provider]  apixaban (ELIQUIS) 5 MG TABS tablet Take 1 tablet (5 mg total) by mouth 2 (two) times daily. 06/27/21 10/25/21  Godfrey Pick, MD  diltiazem (CARDIZEM CD) 120 MG 24 hr capsule Take 1 capsule (120 mg total) by mouth daily. 06/27/21   Godfrey Pick, MD  diltiazem (CARDIZEM) 30 MG tablet take 1 tablet every 4 hours AS NEEDED for heart rate >100 06/27/21   Godfrey Pick, MD       Allergies    Atorvastatin    Review of Systems   Review of Systems  Cardiovascular:  Positive for chest pain and palpitations.  Neurological:  Positive for headaches.  All other systems reviewed and are negative.  Physical Exam Updated Vital Signs BP 111/77   Pulse 63   Temp 99.1 F (37.3 C)   Resp 12   Ht 6\' 5"  (1.956 m)   Wt 81.6 kg   SpO2 98%   BMI 21.34 kg/m  Physical Exam Vitals and nursing note reviewed.  Constitutional:      General: He is not in acute distress.    Appearance: He is well-developed and normal weight. He is not ill-appearing, toxic-appearing or diaphoretic.  HENT:     Head: Normocephalic and atraumatic.  Eyes:     Extraocular Movements: Extraocular movements intact.     Conjunctiva/sclera: Conjunctivae normal.  Neck:     Vascular: No JVD.  Cardiovascular:     Rate and Rhythm: Tachycardia present. Rhythm irregular.     Heart sounds: No murmur heard. Pulmonary:     Effort: Pulmonary effort is normal. No tachypnea or respiratory distress.     Breath sounds: Normal breath sounds.  Chest:     Chest wall: No tenderness.  Abdominal:     Palpations: Abdomen is soft.     Tenderness: There is no abdominal tenderness.  Musculoskeletal:        General: No swelling.  Normal range of motion.     Cervical back: Normal range of motion and neck supple.     Right lower leg: No edema.     Left lower leg: No edema.  Skin:    General: Skin is warm and dry.     Capillary Refill: Capillary refill takes less than 2 seconds.     Coloration: Skin is not cyanotic or pale.  Neurological:     General: No focal deficit present.     Mental Status: He is alert and oriented to person, place, and time.     Cranial Nerves: No cranial nerve deficit.     Motor: No weakness.  Psychiatric:        Mood and Affect: Mood normal.        Behavior: Behavior normal.    ED Results / Procedures / Treatments   Labs (all labs ordered are listed, but only abnormal results are  displayed) Labs Reviewed  COMPREHENSIVE METABOLIC PANEL - Abnormal; Notable for the following components:      Result Value   Anion gap 4 (*)    All other components within normal limits  CBC WITH DIFFERENTIAL/PLATELET - Abnormal; Notable for the following components:   HCT 38.6 (*)    All other components within normal limits  MAGNESIUM  PROTIME-INR  TROPONIN I (HIGH SENSITIVITY)    EKG EKG Interpretation  Date/Time:  Tuesday Jun 27 2021 14:42:19 EDT Ventricular Rate:  103 PR Interval:    QRS Duration: 97 QT Interval:  342 QTC Calculation: 448 R Axis:   91 Text Interpretation: Atrial fibrillation Borderline right axis deviation Confirmed by Godfrey Pick ([id number]) on 06/27/2021 3:03:02 PM  Radiology No results found.  Procedures Procedures    Medications Ordered in ED Medications  aspirin chewable tablet 324 mg (324 mg Oral Given 06/27/21 1614)  diltiazem (CARDIZEM) injection 10 mg (10 mg Intravenous Given 06/27/21 1615)  diltiazem (CARDIZEM CD) 24 hr capsule 120 mg (120 mg Oral Given 06/27/21 1813)  apixaban (ELIQUIS) tablet 5 mg (5 mg Oral Given 06/27/21 1813)    ED Course/ Medical Decision Making/ A&P                           Medical Decision Making Amount and/or Complexity of Data Reviewed Labs: ordered. Radiology: ordered.  Risk OTC drugs. Prescription drug management.   This patient presents to the ED for concern of chest tightness and palpitations, this involves an extensive number of treatment options, and is a complaint that carries with it a high risk of complications and morbidity.  The differential diagnosis includes atrial fibrillation, ACS, pericarditis, PE, pneumonia, other tachydysrhythmia   Co morbidities that complicate the patient evaluation  Paroxysmal atrial fibrillation, lumbar stenosis   Additional history obtained:  Additional history obtained from patient's wife External records from outside source obtained and reviewed including  EMR   Lab Tests:  I Ordered, and personally interpreted labs.  The pertinent results include: Normal electrolytes, normal troponin, normal hemoglobin, no leukocytosis   Imaging Studies ordered:  I ordered imaging studies including chest x-ray I independently visualized and interpreted imaging which showed no acute findings I agree with the radiologist interpretation   Cardiac Monitoring: / EKG:  The patient was maintained on a cardiac monitor.  I personally viewed and interpreted the cardiac monitored which showed an underlying rhythm of: Atrial fibrillation, converted to sinus rhythm  Problem List / ED Course / Critical interventions / Medication management  Patient is a healthy 61 year old male who presents for onset of chest tightness and palpitations this afternoon.  On arrival in the ED, he was found to be in atrial fibrillation.  Heart rate is mildly elevated.  Prior to being bedded in the ED, diagnostic work-up was initiated.  Lab work shows normal electrolytes and normal troponin.  On exam, patient is well-appearing.  He states that, although he has a history of paroxysmal atrial fibrillation, he never started his diltiazem and has not been adherent to his Eliquis.  Patient was given 10 mg IV dose of Cardizem with subsequent resolution of his atrial fibrillation.  Patient now found to be in sinus rhythm with heart rate in the 60s.  He also reports resolution of his symptoms.  Home oral dose of diltiazem was given in addition to his Eliquis.  He was advised to start taking his Cardizem and continue to take his Eliquis.  He is also advised to follow-up with his cardiologist.  He was discharged in good condition. I ordered medication including ASA for possible ACS; Cardizem and Eliquis for atrial fibrillation Reevaluation of the patient after these medicines showed that the patient resolved I have reviewed the patients home medicines and have made adjustments as needed   Social  Determinants of Health:  Has access to outpatient care, including cardiology        Final Clinical Impression(s) / ED Diagnoses Final diagnoses:  Chest pain, unspecified type  Paroxysmal atrial fibrillation ([place])    Rx / DC Orders ED Discharge Orders          Ordered    Amb referral to AFIB Clinic        06/27/21 1533    apixaban (ELIQUIS) 5 MG TABS tablet  2 times daily        06/27/21 1744    diltiazem (CARDIZEM CD) 120 MG 24 hr capsule  Daily        06/27/21 1744    diltiazem (CARDIZEM) 30 MG tablet        06/27/21 1744    Ambulatory referral to Cardiology       Comments: If you have not heard from the Cardiology office within the next 72 hours please call [phone].   06/27/21 1744              Godfrey Pick, MD 06/30/21 1232

## 2021-07-23 ENCOUNTER — Other Ambulatory Visit: Payer: Self-pay | Admitting: Urology

## 2021-07-24 NOTE — Progress Notes (Deleted)
Cardiology Office Note   Date:  07/24/2021   ID:  Craig [name], DOB 12-22-1960, MRN [id number]  PCP:  Pcp, No  Cardiologist:   Nona Dell, MD Referring:  ***  No chief complaint on file.     History of Present Illness: Craig [name] is a 61 y.o. male who presents for ***    He was in the ED for this on 06/27/2021.  I reviewed these records for this visit.     He had a history of chest pain in the past and had normal coronaries on cath in 2020.    He has been noted to have atrial fibrillation paroxysmally in the past.  Mr. Craig [name] has a CHA2DS2 - VASc score of ***.   He has been on Eliquis with plans for him to see EP to talk about possible ablation.   Past Medical History:  Diagnosis Date   Arthritis    Headache    occasional migraines treated with excedrin "a long time ago"   History of cardiac cath    a. cath in 10/2018 showing normal cors with preserved EF.    PONV (postoperative nausea and vomiting)    after knee surgeries    Past Surgical History:  Procedure Laterality Date   CARDIAC CATHETERIZATION  11/05/2018   INGUINAL HERNIA REPAIR Right 11/11/2020   Procedure: HERNIA REPAIR INGUINAL WITH MESH;  Surgeon: Franky Macho, MD;  Location: [facility];  Service: General;  Laterality: Right;   KNEE SURGERY Bilateral    "bone spurr or cartilage operations", x6   LEFT HEART CATH AND CORONARY ANGIOGRAPHY N/A 11/05/2018   Procedure: LEFT HEART CATH AND CORONARY ANGIOGRAPHY;  Surgeon: Marykay Lex, MD;  Location: [facility] INVASIVE CV LAB;  Service: Cardiovascular;  Laterality: N/A;   LUMBAR LAMINECTOMY/DECOMPRESSION MICRODISCECTOMY N/A 08/24/2015   Procedure: L3-4 CENTRAL DECOMPRESSION ;  Surgeon: Eldred Manges, MD;  Location: [facility] OR;  Service: Orthopedics;  Laterality: N/A;   RETINAL DETACHMENT SURGERY Right    RETINAL TEAR REPAIR CRYOTHERAPY Left      Current Outpatient Medications  Medication Sig Dispense Refill   apixaban (ELIQUIS) 5 MG TABS tablet Take 1  tablet (5 mg total) by mouth 2 (two) times daily. 60 tablet 3   diltiazem (CARDIZEM CD) 120 MG 24 hr capsule Take 1 capsule (120 mg total) by mouth daily. 30 capsule 3   diltiazem (CARDIZEM) 30 MG tablet take 1 tablet every 4 hours AS NEEDED for heart rate >100 60 tablet 0   tadalafil (CIALIS) 20 MG tablet Take 20 mg by mouth daily as needed for erectile dysfunction.     No current facility-administered medications for this visit.    Allergies:   Atorvastatin    Social History:  The patient  reports that he quit smoking about 2 years ago. His smoking use included cigars. He has never used smokeless tobacco. He reports current drug use. Drug: Marijuana. He reports that he does not drink alcohol.   Family History:  The patient's ***family history includes Atrial fibrillation in his mother; Coronary artery disease in his father.    ROS:  Please see the history of present illness.   Otherwise, review of systems are positive for {NONE DEFAULTED:18576}.   All other systems are reviewed and negative.    PHYSICAL EXAM: VS:  There were no vitals taken for this visit. , BMI There is no height or weight on file to calculate BMI. GENERAL:  Well appearing HEENT:  Pupils equal round and reactive, fundi not visualized, oral mucosa unremarkable NECK:  No jugular venous distention, waveform within normal limits, carotid upstroke brisk and symmetric, no bruits, no thyromegaly LYMPHATICS:  No cervical, inguinal adenopathy LUNGS:  Clear to auscultation bilaterally BACK:  No CVA tenderness CHEST:  Unremarkable HEART:  PMI not displaced or sustained,S1 and S2 within normal limits, no S3, no S4, no clicks, no rubs, *** murmurs ABD:  Flat, positive bowel sounds normal in frequency in pitch, no bruits, no rebound, no guarding, no midline pulsatile mass, no hepatomegaly, no splenomegaly EXT:  2 plus pulses throughout, no edema, no cyanosis no clubbing SKIN:  No rashes no nodules NEURO:  Cranial nerves II  through XII grossly intact, motor grossly intact throughout PSYCH:  Cognitively intact, oriented to person place and time    EKG:  EKG {ACTION; IS/IS UTM:[id number]} ordered today. The ekg ordered today demonstrates ***   Recent Labs: 12/15/2020: B Natriuretic Peptide 42.0; TSH 1.210 06/27/2021: ALT 15; BUN 16; Creatinine, Ser 1.02; Hemoglobin 13.0; Magnesium 2.3; Platelets 227; Potassium 4.1; Sodium 139    Lipid Panel    Component Value Date/Time   CHOL 157 11/05/2018 0902   TRIG 62 11/05/2018 0902   HDL 46 11/05/2018 0902   CHOLHDL 3.4 11/05/2018 0902   VLDL 12 11/05/2018 0902   LDLCALC 99 11/05/2018 0902      Wt Readings from Last 3 Encounters:  06/27/21 180 lb (81.6 kg)  05/23/21 184 lb (83.5 kg)  12/22/20 185 lb 9.6 oz (84.2 kg)      Other studies Reviewed: Additional studies/ records that were reviewed today include: ***. Review of the above records demonstrates:  Please see elsewhere in the note.  ***   ASSESSMENT AND PLAN:  Chest pain:  ***  PAF:  ***    Current medicines are reviewed at length with the patient today.  The patient {ACTIONS; HAS/DOES NOT HAVE:19233} concerns regarding medicines.  The following changes have been made:  {PLAN; NO CHANGE:13088:s}  Labs/ tests ordered today include: *** No orders of the defined types were placed in this encounter.    Disposition:   FU with ***    Signed, Rollene Rotunda, MD  07/24/2021 8:25 AM    [facility]

## 2021-07-25 ENCOUNTER — Ambulatory Visit: Payer: BC Managed Care – PPO | Admitting: Cardiology

## 2021-07-30 NOTE — Progress Notes (Unsigned)
Cardiology Office Note   Date:  07/31/2021   ID:  Craig [name], DOB Apr 27, 1960, MRN [id number]  PCP:  Pcp, No  Cardiologist:  Dr. Domenic Polite    Chief Complaint  Patient presents with   Atrial Fibrillation      History of Present Illness: Craig [name] is a 61 y.o. male who presents for post ER hospitalization.   He has a history of tobacco use and atrial fibrillation, 11/05/2018 for evaluation of chest pain. EKG showed no acute ischemic changes but high-sensitivity troponin values were found to be elevated to 712. LHC showed angiographically normal coronary arteries. Echo showed preserved EF of 60 to 65% with no regional wall motion abnormalities and no evidence of a pericardial effusion. It was thought that his symptoms might be secondary to vasospasm or myocarditis.   The patient was initially diagnosed with atrial fibrillation 12/15/20 after presenting to the ED with symptoms of palpitations and some mild chest tightness. ECG showed afib with RVR. He has had intermittent  palpitations for the past year. He was started on a diltiazem drip which converted him to SR. He was started on Eliquis for a CHADS2VASC score of 0. He has palpitations about once per week.    Last seen in A fib clinic 12/22/20 without issues.  He did wish to speak to EP about ablation.   He was seen in ER 06/27/21 for chest pain, found to be in atrial fib.  Rate 103, he was instructed to take his dilt  120 mg daily, he had only used PRN before.   Labs in ER trop was 4, k+ 4.1 BUN 16 Cr 1.02  LFTs WNL Mg 2.3 WBC WNL  He believes he converted to SR in ER.  No EKG with SR.  Today he has no complaints - piror to the ER visit he had some chest tightness but was HR controlled and his sp02 was a little low, he could not give me a number.  No chest tightness since and he is active.  He is on dilt 120 and the eliquis.  He is concerned about CVA with all 4 grandparents having strokes along with his mother.  His age is young  and on last visit with A fib clinic the pt opted to continue eliquis, now he believes he would like to stop.  He is on dilt 120 and taking daily, he does not have the 30 mg but we discussed if he did have recurrent a fib he could take 30 mg and maybe save a trip to ER.  We discussed EP consult for ablation but he tells me he has so few episodes he will hold off.       Atrial Fibrillation Risk Factors:   he does not have symptoms or diagnosis of sleep apnea. he does not have a history of rheumatic fever. he does not have a history of alcohol use. The patient does have a history of early familial atrial fibrillation or other arrhythmias. Mother ha  Past Medical History:  Diagnosis Date   Arthritis    Headache    occasional migraines treated with excedrin "a long time ago"   History of cardiac cath    a. cath in 10/2018 showing normal cors with preserved EF.    PONV (postoperative nausea and vomiting)    after knee surgeries    Past Surgical History:  Procedure Laterality Date   CARDIAC CATHETERIZATION  11/05/2018   INGUINAL HERNIA REPAIR Right 11/11/2020  Procedure: HERNIA REPAIR INGUINAL WITH MESH;  Surgeon: Franky Macho, MD;  Location: [facility];  Service: General;  Laterality: Right;   KNEE SURGERY Bilateral    "bone spurr or cartilage operations", x6   LEFT HEART CATH AND CORONARY ANGIOGRAPHY N/A 11/05/2018   Procedure: LEFT HEART CATH AND CORONARY ANGIOGRAPHY;  Surgeon: Marykay Lex, MD;  Location: [facility] INVASIVE CV LAB;  Service: Cardiovascular;  Laterality: N/A;   LUMBAR LAMINECTOMY/DECOMPRESSION MICRODISCECTOMY N/A 08/24/2015   Procedure: L3-4 CENTRAL DECOMPRESSION ;  Surgeon: Eldred Manges, MD;  Location[facility];  Service: Orthopedics;  Laterality: N/A;   RETINAL DETACHMENT SURGERY Right    RETINAL TEAR REPAIR CRYOTHERAPY Left      Current Outpatient Medications  Medication Sig Dispense Refill   apixaban (ELIQUIS) 5 MG TABS tablet Take 1 tablet (5 mg total) by mouth 2 (two)  times daily. 60 tablet 3   diltiazem (CARDIZEM CD) 120 MG 24 hr capsule Take 1 capsule (120 mg total) by mouth daily. 30 capsule 3   tadalafil (CIALIS) 20 MG tablet TAKE 1/2 TO 1 (ONE-HALF TO ONE) TABLET BY MOUTH AS NEEDED 30 tablet 11   diltiazem (CARDIZEM) 30 MG tablet take 1 tablet every 4 hours AS NEEDED for heart rate >100 (Patient not taking: Reported on 07/31/2021) 60 tablet 0   No current facility-administered medications for this visit.    Allergies:   Atorvastatin    Social History:  The patient  reports that he quit smoking about 2 years ago. His smoking use included cigars. He has never used smokeless tobacco. He reports current drug use. Drug: Marijuana. He reports that he does not drink alcohol.   Family History:  The patient's family history includes Atrial fibrillation in his mother; Coronary artery disease in his father.    ROS:  General:no colds or fevers, no weight changes Skin:no rashes or ulcers HEENT:no blurred vision, no congestion CV:see HPI PUL:see HPI GI:no diarrhea constipation or melena, no indigestion GU:no hematuria, no dysuria MS:no joint pain, no claudication Neuro:no syncope, no lightheadedness Endo:no diabetes, no thyroid disease  Wt Readings from Last 3 Encounters:  07/31/21 188 lb (85.3 kg)  06/27/21 180 lb (81.6 kg)  05/23/21 184 lb (83.5 kg)     PHYSICAL EXAM: VS:  BP 112/66   Pulse 73   Ht 6\' 5"  (1.956 m)   Wt 188 lb (85.3 kg)   SpO2 97%   BMI 22.29 kg/m  , BMI Body mass index is 22.29 kg/m. General:Pleasant affect, NAD Skin:Warm and dry, brisk capillary refill HEENT:normocephalic, sclera clear, mucus membranes moist Neck:supple, no JVD, no bruits  Heart:S1S2 RRR without murmur, gallup, rub or click Lungs:clear without rales, rhonchi, or wheezes , non tender, + BS, do not palpate liver spleen or masses Ext:no lower ext edema, 2+ pedal pulses, 2+ radial pulses Neuro:alert and oriented X 3, MAE, follows commands, + facial  symmetry    EKG:  EKG is ordered today. The ekg ordered today demonstrates SR with PVC and Rt atrial enlargement Rt ward axis   Recent Labs: 12/15/2020: B Natriuretic Peptide 42.0; TSH 1.210 06/27/2021: ALT 15; BUN 16; Creatinine, Ser 1.02; Hemoglobin 13.0; Magnesium 2.3; Platelets 227; Potassium 4.1; Sodium 139    Lipid Panel    Component Value Date/Time   CHOL 157 11/05/2018 0902   TRIG 62 11/05/2018 0902   HDL 46 11/05/2018 0902   CHOLHDL 3.4 11/05/2018 0902   VLDL 12 11/05/2018 0902   LDLCALC 99 11/05/2018 0902  Other studies Reviewed: Additional studies/ records that were reviewed today include: . Echo 11/05/18 demonstrated   1. Left ventricular ejection fraction, by visual estimation, is 60 to  65%. The left ventricle has normal function. Normal left ventricular size. There is no left ventricular hypertrophy.   2. Left ventricular diastolic Doppler parameters are indeterminate  pattern of LV diastolic filling.   3. Global right ventricle has normal systolic function.The right  ventricular size is normal. No increase in right ventricular wall  thickness.   4. Left atrial size was normal.   5. Right atrial size was normal.   6. The mitral valve is normal in structure. Trace mitral valve  regurgitation. No evidence of mitral stenosis.   7. The tricuspid valve is normal in structure. Tricuspid valve  regurgitation was not visualized by color flow Doppler.   8. The aortic valve is normal in structure. Aortic valve regurgitation  was not visualized by color flow Doppler. Mild aortic valve sclerosis  without stenosis.   9. The pulmonic valve was normal in structure. Pulmonic valve  regurgitation is not visualized by color flow Doppler.  10. The inferior vena cava is dilated in size with <50% respiratory  variability, suggesting right atrial pressure of 15 mmHg.    Cardiac cath 11/05/18  The left ventricular systolic function is normal. LV end diastolic pressure is  normal. The left ventricular ejection fraction is 55-65% by visual estimate. There is no aortic valve stenosis. There is no mitral valve regurgitation.   SUMMARY Angiographically normal coronary arteries Normal left ventricular function with normal filling pressures.     RECOMMENDATIONS Would look for non-coronary artery disease related reason for troponin elevation, consider pericarditis. Otherwise would be stable for discharge from cardiac standpoint either the second tomorrow morning.    Diagnostic Dominance: Right  Intervention    ASSESSMENT AND PLAN:  1.  PAF - recent episode now in SR.  Continue dilt po and use dilt 30 prn.  I asked him to continue the eliquis until seen in a fib clinic.  He prefers to hold off on seeing EP for ablation at this time.  Pt would like to return to no medications if possible.  At this point he may need to continue dilt at least.  Will have him follow up with A fib clinic in 1-2 months.   2.  Chest pain most likely related to atrial fib -no further episodes.  The pressure was at end of school year ad not just with atrial fib.  He had normal cors in 2020.  If any further episodes he will call and he may need nus stress test.     Current medicines are reviewed with the patient today.  The patient Has no concerns regarding medicines.  The following changes have been made:  See above Labs/ tests ordered today include:see above  Disposition:   FU:  see above  Signed, Nada Boozer, NP  07/31/2021 2:31 PM    [facility] [address] [place], [place], [place]  [address]/ [address] [place], [place] Phone: [phone]; Fax: [phone]  [phone]

## 2021-07-31 ENCOUNTER — Encounter: Payer: Self-pay | Admitting: Cardiology

## 2021-07-31 ENCOUNTER — Ambulatory Visit: Payer: BC Managed Care – PPO | Admitting: Cardiology

## 2021-07-31 VITALS — BP 112/66 | HR 73 | Ht 77.0 in | Wt 188.0 lb

## 2021-07-31 DIAGNOSIS — R079 Chest pain, unspecified: Secondary | ICD-10-CM

## 2021-07-31 DIAGNOSIS — I480 Paroxysmal atrial fibrillation: Secondary | ICD-10-CM

## 2021-07-31 MED ORDER — DILTIAZEM HCL 30 MG PO TABS: ORAL_TABLET | ORAL | 3 refills | Status: DC

## 2021-07-31 NOTE — Patient Instructions (Signed)
Medication Instructions:  ?Your physician recommends that you continue on your current medications as directed. Please refer to the Current Medication list given to you today. ? ?*If you need a refill on your cardiac medications before your next appointment, please call your pharmacy* ? ? ?Lab Work: ?NONE  ? ?If you have labs (blood work) drawn today and your tests are completely normal, you will receive your results only by: ?MyChart Message (if you have MyChart) OR ?A paper copy in the mail ?If you have any lab test that is abnormal or we need to change your treatment, we will call you to review the results. ? ? ?Testing/Procedures: ?NONE  ? ? ?Follow-Up: ?At [facility], you and your health needs are our priority.  As part of our continuing mission to provide you with exceptional heart care, we have created designated Provider Care Teams.  These Care Teams include your primary Cardiologist (physician) and Advanced Practice Providers (APPs -  Physician Assistants and Nurse Practitioners) who all work together to provide you with the care you need, when you need it. ? ?We recommend signing up for the patient portal called "MyChart".  Sign up information is provided on this After Visit Summary.  MyChart is used to connect with patients for Virtual Visits (Telemedicine).  Patients are able to view lab/test results, encounter notes, upcoming appointments, etc.  Non-urgent messages can be sent to your provider as well.   ?To learn more about what you can do with MyChart, go to [email or web address].   ? ?Your next appointment:   ?1 year(s) ? ?The format for your next appointment:   ?In Person ? ?Provider:   ?Samuel McDowell, MD  ? ? ?Other Instructions ?Thank you for choosing [facility]! ? ? ? ?Important Information About Sugar ? ? ? ? ? ? ?

## 2021-08-16 ENCOUNTER — Encounter: Payer: Self-pay | Admitting: Emergency Medicine

## 2021-08-16 ENCOUNTER — Ambulatory Visit
Admission: EM | Admit: 2021-08-16 | Discharge: 2021-08-16 | Disposition: A | Discharge status: Home / Self Care | Payer: BC Managed Care – PPO | Attending: Family Medicine | Admitting: Family Medicine

## 2021-08-16 DIAGNOSIS — R591 Generalized enlarged lymph nodes: Secondary | ICD-10-CM | POA: Diagnosis not present

## 2021-08-16 DIAGNOSIS — K047 Periapical abscess without sinus: Secondary | ICD-10-CM

## 2021-08-16 MED ORDER — AMOXICILLIN-POT CLAVULANATE 875-125 MG PO TABS: 1.0000 | ORAL_TABLET | Freq: Two times a day (BID) | ORAL | 0 refills | Status: DC

## 2021-08-16 NOTE — ED Provider Notes (Signed)
[facility]    CSN: [id number] Arrival date & time: 08/16/21  1343      History   Chief Complaint No chief complaint on file.   HPI Craig [name] is a 61 y.o. male.   Presenting today with a tender knot on the right side of his face near his TMJ that has been present for the past 3 to 4 days, slightly worsening.  He denies fever, chills, redness, headaches, difficulty opening jaw, rashes.  He states he has a broken molar near this area that he is remaining to get taking care of for quite some time that has been painful and swollen off and on.  Thinks this is related.  He is also gotten multiple ticks off of him in the past few weeks but does not think this to be related.  Has not been trying anything over-the-counter for symptoms.   Past Medical History:  Diagnosis Date   Arthritis    Headache    occasional migraines treated with excedrin "a long time ago"   History of cardiac cath    a. cath in 10/2018 showing normal cors with preserved EF.    PONV (postoperative nausea and vomiting)    after knee surgeries   Patient Active Problem List   Diagnosis Date Noted   Paroxysmal atrial fibrillation (HCC) 12/22/2020   Right inguinal hernia    Hydrocele, right    Elevated troponin level not due to acute coronary syndrome 11/05/2018   Lumbar stenosis 08/24/2015   Past Surgical History:  Procedure Laterality Date   CARDIAC CATHETERIZATION  11/05/2018   INGUINAL HERNIA REPAIR Right 11/11/2020   Procedure: HERNIA REPAIR INGUINAL WITH MESH;  Surgeon: Franky Macho, MD;  Location: [facility];  Service: General;  Laterality: Right;   KNEE SURGERY Bilateral    "bone spurr or cartilage operations", x6   LEFT HEART CATH AND CORONARY ANGIOGRAPHY N/A 11/05/2018   Procedure: LEFT HEART CATH AND CORONARY ANGIOGRAPHY;  Surgeon: Marykay Lex, MD;  Location: [facility];  Service: Cardiovascular;  Laterality: N/A;   LUMBAR LAMINECTOMY/DECOMPRESSION MICRODISCECTOMY N/A  08/24/2015   Procedure: L3-4 CENTRAL DECOMPRESSION ;  Surgeon: Eldred Manges, MD;  Location: [facility] OR;  Service: Orthopedics;  Laterality: N/A;   RETINAL DETACHMENT SURGERY Right    RETINAL TEAR REPAIR CRYOTHERAPY Left      Home Medications    Prior to Admission medications   Medication Sig Start Date End Date Taking? Authorizing Provider  amoxicillin-clavulanate (AUGMENTIN) 875-125 MG tablet Take 1 tablet by mouth every 12 (twelve) hours. 08/16/21  Yes Particia Nearing, PA-C  apixaban (ELIQUIS) 5 MG TABS tablet Take 1 tablet (5 mg total) by mouth 2 (two) times daily. 06/27/21 10/25/21  Gloris Manchester, MD  diltiazem (CARDIZEM CD) 120 MG 24 hr capsule Take 1 capsule (120 mg total) by mouth daily. 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: 10/29/2018    Years since quitting: 2.8   Smokeless tobacco: Never   Tobacco comments:    Former cigar smoker 12/22/2020  Vaping Use   Vaping Use: Never used  Substance Use Topics   Alcohol use: No   Drug use: Yes    Types: Marijuana    Comment: daily    Allergies   Atorvastatin  Review of Systems Review of Systems PER HPI  Physical Exam Triage Vital Signs ED Triage Vitals  Enc Vitals Group     BP 08/16/21 1351 113/70     Pulse Rate 08/16/21 1351 77     Resp 08/16/21 1351 18     Temp 08/16/21 1351 98.9 F (37.2 C)     Temp Source 08/16/21 1351 Oral     SpO2 08/16/21 1351 96 %     Weight --      Height --      Head Circumference --      Peak Flow --       Pain Score 08/16/21 1353 0     Pain Loc --      Pain Edu? --      Excl. in GC? --    No data found.  Updated Vital Signs BP 113/70 (BP Location: Right Arm)   Pulse 77   Temp 98.9 F (37.2 C) (Oral)   Resp 18   SpO2 96%   Visual Acuity Right Eye Distance:   Left Eye Distance:   Bilateral Distance:    Right Eye Near:   Left Eye Near:    Bilateral Near:     Physical Exam Vitals and nursing note reviewed.  Constitutional:      Appearance: Normal appearance.  HENT:     Head: Atraumatic.     Mouth/Throat:     Mouth: Mucous membranes are moist.     Comments: Broken right lower molar with gingival erythema, edema Eyes:     Extraocular Movements: Extraocular movements intact.     Conjunctiva/sclera: Conjunctivae normal.  Cardiovascular:     Rate and Rhythm: Normal rate and regular rhythm.  Pulmonary:     Effort: Pulmonary effort is normal.     Breath sounds: Normal breath sounds.  Musculoskeletal:        General: Normal range of motion.     Cervical back: Normal range of motion and neck supple.  Lymphadenopathy:     Head:     Right side of head: Preauricular adenopathy present.  Skin:    General: Skin is warm and dry.  Neurological:     General: No focal deficit present.     Mental Status: He is oriented to person, place, and time.     Motor: No weakness.     Gait: Gait normal.  Psychiatric:        Mood and Affect: Mood normal.        Thought Content: Thought content normal.        Judgment: Judgment normal.    UC Treatments / Results  Labs (all labs ordered are listed, but only abnormal results are displayed) Labs Reviewed - No data to display  EKG  Radiology No results found.  Procedures Procedures (including critical care time)  Medications Ordered in UC Medications - No data to display  Initial Impression / Assessment and Plan / UC Course  I have reviewed the triage vital signs and the nursing notes.  Pertinent labs & imaging results that  were available during my care of  the patient were reviewed by me and considered in my medical decision making (see chart for details).     Will cover for a dental infection, warm compresses, close monitoring for benefit.  Follow-up with PCP if not fully resolving.  Final Clinical Impressions(s) / UC Diagnoses   Final diagnoses:  Lymphadenopathy  Dental infection   Discharge Instructions   None    ED Prescriptions     Medication Sig Dispense Auth. Provider   amoxicillin-clavulanate (AUGMENTIN) 875-125 MG tablet Take 1 tablet by mouth every 12 (twelve) hours. 14 tablet Particia Nearing, [place]      PDMP not reviewed this encounter.   Particia Nearing, [place] 08/16/21 1425

## 2021-08-16 NOTE — ED Triage Notes (Signed)
Knot on right side of face 3 to 4 days.  States he has pulled several ticks off over the past several weeks.  Also states he has a bad tooth on the right lower side of mouth.

## 2022-01-24 ENCOUNTER — Other Ambulatory Visit (HOSPITAL_COMMUNITY): Payer: Self-pay | Admitting: Physician Assistant

## 2022-04-12 ENCOUNTER — Encounter: Payer: Self-pay | Admitting: Radiology

## 2022-07-02 ENCOUNTER — Other Ambulatory Visit: Payer: Self-pay | Admitting: *Deleted

## 2022-07-02 MED ORDER — APIXABAN 5 MG PO TABS: 5.0000 mg | ORAL_TABLET | Freq: Two times a day (BID) | ORAL | 5 refills | Status: DC

## 2022-07-02 NOTE — Telephone Encounter (Signed)
Prescription refill request for Eliquis received. Indication: PAF Last office visit: 07/31/21  Sherian Rein NP Scr: 1.02 on 06/27/21  Epic Age: 62 Weight: 85.3 kg  Based on above findings Eliquis 5mg  twice daily is the appropriate dose.  Refill approved.  Pt due to see Dr Diona Browner in June.  Message sent to schedulers to make appt.

## 2022-08-31 ENCOUNTER — Other Ambulatory Visit
Admission: RE | Admit: 2022-08-31 | Discharge: 2022-08-31 | Disposition: A | Discharge status: Home / Self Care | Payer: BC Managed Care – PPO | Source: Ambulatory Visit | Attending: Cardiology | Admitting: Cardiology

## 2022-08-31 ENCOUNTER — Ambulatory Visit (INDEPENDENT_AMBULATORY_CARE_PROVIDER_SITE_OTHER): Payer: BC Managed Care – PPO | Admitting: Cardiology

## 2022-08-31 ENCOUNTER — Encounter: Payer: Self-pay | Admitting: Cardiology

## 2022-08-31 VITALS — BP 102/72 | HR 78 | Ht 77.0 in | Wt 183.4 lb

## 2022-08-31 DIAGNOSIS — I480 Paroxysmal atrial fibrillation: Secondary | ICD-10-CM | POA: Diagnosis not present

## 2022-08-31 LAB — BASIC METABOLIC PANEL
Anion gap: 7 (ref 5–15)
BUN: 14 mg/dL (ref 6–20)
CO2: 23 mmol/L (ref 22–32)
Calcium: 8.9 mg/dL (ref 8.9–10.3)
Chloride: 106 mmol/L (ref 98–111)
Creatinine, Ser: 0.79 mg/dL (ref 0.61–1.24)
GFR, Estimated: >60 mL/min (ref >60)
Glucose, Bld: 105 mg/dL — ABNORMAL HIGH (ref 70–99)
Potassium: 4.4 mmol/L (ref 3.5–5.1)
Sodium: 136 mmol/L (ref 135–145)

## 2022-08-31 LAB — CBC
HCT: 40.7 % (ref 39.0–52.0)
Hemoglobin: 13.5 g/dL (ref 13.0–17.0)
MCH: 28.8 pg (ref 26.0–34.0)
MCHC: 33.2 g/dL (ref 30.0–36.0)
MCV: 86.8 fL (ref 80.0–100.0)
Platelets: 285 10*3/uL (ref 150–400)
RBC: 4.69 MIL/uL (ref 4.22–5.81)
RDW: 14.9 % (ref 11.5–15.5)
WBC: 8.6 10*3/uL (ref 4.0–10.5)
nRBC: 0 % (ref 0.0–0.2)

## 2022-08-31 NOTE — Patient Instructions (Signed)
Medication Instructions:   Your physician recommends that you continue on your current medications as directed. Please refer to the Current Medication list given to you today.  Labwork: Cbc,bmet  Testing/Procedures: None today  Follow-Up: 1 year  Any Other Special Instructions Will Be Listed Below (If Applicable).  If you need a refill on your cardiac medications before your next appointment, please call your pharmacy.

## 2022-08-31 NOTE — Progress Notes (Signed)
Cardiology Office Note  Date: 08/31/2022   ID: Craig [name], DOB Aug 22, 1960, MRN [id number]  History of Present Illness: Craig [name] is a 62 y.o. male last seen in June 2023 by Ms. Ingold NP, I reviewed the note. He is here for a routine visit. Reports only rare, brief palpitations associated with chest discomfort. No dizziness or syncope.  He has continued on Eliquis, although CHA2DS2-VASc score is 0. He states that his mother had a major stroke due to atrial fibrillation and he prefers anticoagulation. He is also on Cardizem CD as before, but has not required any PRN short acting Cardizem.  He is busy as a Education officer, museum, also starting a new business. Managing stress along the way.  He does not a PCP, goes to urgent care as needed. No lab work in the last year.  ECG today shows sinus rhythm.   Physical Exam: VS:  BP 102/72 (BP Location: Left Arm, Patient Position: Sitting, Cuff Size: Normal)   Pulse 78   Ht 6\' 5"  (1.956 m)   Wt 183 lb 6.4 oz (83.2 kg)   SpO2 96%   BMI 21.75 kg/m , BMI Body mass index is 21.75 kg/m.  Wt Readings from Last 3 Encounters:  08/31/22 183 lb 6.4 oz (83.2 kg)  07/31/21 188 lb (85.3 kg)  06/27/21 180 lb (81.6 kg)    General: Patient appears comfortable at rest. HEENT: Conjunctiva and lids normal. Neck: Supple, no elevated JVP or carotid bruits. Lungs: Clear to auscultation, nonlabored breathing at rest. Cardiac: Regular rate and rhythm, no S3 or significant systolic murmur. Extremities: No pitting edema.  ECG:  An ECG dated 07/31/2021 was personally reviewed today and demonstrated:  Sinus rhythm with PVC and rightward axis.  Labwork: 08/31/2022: BUN 14; Creatinine, Ser 0.79; Hemoglobin 13.5; Platelets 285; Potassium 4.4; Sodium 136     Component Value Date/Time   CHOL 157 11/05/2018 0902   TRIG 62 11/05/2018 0902   HDL 46 11/05/2018 0902   CHOLHDL 3.4 11/05/2018 0902   VLDL 12 11/05/2018 0902   LDLCALC 99 11/05/2018 0902    Other Studies Reviewed Today:  Echocardiogram 11/05/2018:  1. Left ventricular ejection fraction, by visual estimation, is 60 to  65%. The left ventricle has normal function. Normal left ventricular size.  There is no left ventricular hypertrophy.   2. Left ventricular diastolic Doppler parameters are indeterminate  pattern of LV diastolic filling.   3. Global right ventricle has normal systolic function.The right  ventricular size is normal. No increase in right ventricular wall  thickness.   4. Left atrial size was normal.   5. Right atrial size was normal.   6. The mitral valve is normal in structure. Trace mitral valve  regurgitation. No evidence of mitral stenosis.   7. The tricuspid valve is normal in structure. Tricuspid valve  regurgitation was not visualized by color flow Doppler.   8. The aortic valve is normal in structure. Aortic valve regurgitation  was not visualized by color flow Doppler. Mild aortic valve sclerosis  without stenosis.   9. The pulmonic valve was normal in structure. Pulmonic valve  regurgitation is not visualized by color flow Doppler.  10. The inferior vena cava is dilated in size with <50% respiratory  variability, suggesting right atrial pressure of 15 mmHg.   Assessment and Plan:  1. Paroxysmal atrial fibrillation with very infrequent symptoms. CHA2DS2-VASc score is 0, although he [prefers to continue anticoagulation as discussed above and is on Eliquis.  No spontaneous bleeding problems reported. Continue Cardizem CD at current dose. Check CBC and BMET. We did discuss other general treatment strategies for atrial fibrillation depending on how he does in terms of symptoms and frequency of arrhythmia.  2. History of normal coronaries by cardiac catheterization in 2020.  Disposition:  Follow up  1 year.  Signed, Jonelle Sidle, M.D., F.A.C.C. [facility]

## 2023-01-06 ENCOUNTER — Other Ambulatory Visit: Payer: Self-pay | Admitting: Urology

## 2023-02-10 ENCOUNTER — Other Ambulatory Visit: Payer: Self-pay | Admitting: Urology

## 2023-06-14 ENCOUNTER — Other Ambulatory Visit: Payer: Self-pay | Admitting: Urology

## 2023-06-19 ENCOUNTER — Other Ambulatory Visit: Payer: Self-pay | Admitting: Urology

## 2023-10-04 ENCOUNTER — Encounter: Payer: Self-pay | Admitting: Radiology

## 2023-11-13 ENCOUNTER — Ambulatory Visit: Attending: Cardiology | Admitting: Cardiology

## 2023-11-13 ENCOUNTER — Encounter: Payer: Self-pay | Admitting: Cardiology

## 2023-11-13 VITALS — BP 120/62 | HR 74 | Ht 77.0 in | Wt 190.0 lb

## 2023-11-13 DIAGNOSIS — I480 Paroxysmal atrial fibrillation: Secondary | ICD-10-CM

## 2023-11-13 NOTE — Progress Notes (Signed)
    Cardiology Office Note  Date: 11/13/2023   ID: Craig [name], DOB March 05, 1960, MRN [id number]  History of Present Illness: Craig [name] is a 63 y.o. male last seen in July 2024.  He is here for a follow-up visit.  Reports only occasional, brief palpitations, no progressive symptoms.  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  He has stopped Eliquis  and Cardizem  CD.  His wife is a Publishing rights manager with integrative medicine training, he is considering other supplements at this time.  I reviewed his ECG today which shows normal sinus rhythm.  As discussed previously, he does have a relatively low thromboembolic risk score and I think that it is reasonable to be off anticoagulation at this time.  If however his atrial fibrillation becomes more frequent or prolonged, we may need to consider other treatment options for symptom control.  Physical Exam: VS:  BP 120/62   Pulse 74   Ht 6' 5 (1.956 m)   Wt 190 lb (86.2 kg)   SpO2 97%   BMI 22.53 kg/m , BMI Body mass index is 22.53 kg/m.  Wt Readings from Last 3 Encounters:  11/13/23 190 lb (86.2 kg)  08/31/22 183 lb 6.4 oz (83.2 kg)  07/31/21 188 lb (85.3 kg)    General: Patient appears comfortable at rest. HEENT: Conjunctiva and lids normal. Neck: Supple, no elevated JVP or carotid bruits. Lungs: Clear to auscultation, nonlabored breathing at rest. Cardiac: Regular rate and rhythm, no S3 or significant systolic murmur. Abdomen: Soft, bowel sounds present.  ECG:  An ECG dated 08/31/2022 was personally reviewed today and demonstrated:  Sinus rhythm.  Labwork:  July 2024: Hemoglobin 13.5, platelets 285, potassium 4.4, BUN 14, creatinine 0.79  Other Studies Reviewed Today:  No interval cardiac testing for review today.  Assessment and Plan:  1. Paroxysmal atrial fibrillation. CHA2DS2-VASc score is 0.  He does not report any progressive palpitations and is in sinus rhythm by ECG today.  He came off both Eliquis   and Cardizem  CD since our last visit and is comfortable with observation at this point following discussion today.  Will see him back annually unless symptoms escalate.   2. History of normal coronaries by cardiac catheterization in 2020.  Disposition:  Follow up 1 year.  Signed, Jayson JUDITHANN Sierras, M.D., F.A.C.C. [facility] at [facility]

## 2023-11-13 NOTE — Patient Instructions (Signed)
 Medication Instructions:  Your physician recommends that you continue on your current medications as directed. Please refer to the Current Medication list given to you today.   Labwork: None today  Testing/Procedures: None today  Follow-Up: 1 year  Any Other Special Instructions Will Be Listed Below (If Applicable).  If you need a refill on your cardiac medications before your next appointment, please call your pharmacy.

## 2023-12-16 ENCOUNTER — Encounter: Payer: Self-pay | Admitting: Radiology

## 2024-01-03 ENCOUNTER — Other Ambulatory Visit: Payer: Self-pay | Admitting: Urology

## 2024-01-08 ENCOUNTER — Telehealth: Payer: Self-pay | Admitting: Cardiology

## 2024-01-08 NOTE — Telephone Encounter (Signed)
*  STAT* If patient is at the pharmacy, call can be transferred to refill team.   1. Which medications need to be refilled? (please list name of each medication and dose if known)   tadalafil (CIALIS) 20 MG tablet   2. Would you like to learn more about the convenience, safety, & potential cost savings by using the [facility]?   3. Are you open to using the [facility] (Type [facility]. ).  4. Which pharmacy/location (including street and city if local pharmacy) is medication to be sent to?  [id number] - [place], [place] - [address]   5. Do they need a 30 day or 90 day supply?   Patient stated he is completely out of this medication.

## 2024-01-13 NOTE — Telephone Encounter (Signed)
 Left a message for pt that per Dr. Debera, he can call his Urologist's office for a refill of the Cialis.

## 2024-01-16 ENCOUNTER — Encounter: Payer: Self-pay | Admitting: Cardiology

## 2024-01-17 ENCOUNTER — Telehealth: Payer: Self-pay

## 2024-01-17 NOTE — Telephone Encounter (Signed)
 Return call to pt making him aware that he will need a referral for a new patient appointment. Pt state's he do not have a PCP to get a referral. Per Alan scheduled pt for New appointment.

## 2024-01-17 NOTE — Telephone Encounter (Signed)
 Pt c/o medication issue:  1. Name of Medication:   tadalafil (CIALIS) 20 MG tablet   2. How are you currently taking this medication (dosage and times per day)?   3. Are you having a reaction (difficulty breathing--STAT)?   4. What is your medication issue?    Patient wants a call back to discuss getting a prescription for this medication.

## 2024-03-16 ENCOUNTER — Ambulatory Visit: Admitting: Urology

## 2024-04-13 ENCOUNTER — Ambulatory Visit: Admitting: Urology
